# Patient Record
Sex: Female | Born: 1950 | Hispanic: No | Marital: Married | Smoking: Never smoker
Health system: Southern US, Community
[De-identification: ages and names within clinical notes are randomized; demographics above are authoritative.]

## PROBLEM LIST (undated history)

## (undated) DIAGNOSIS — E785 Hyperlipidemia, unspecified: Secondary | ICD-10-CM

## (undated) DIAGNOSIS — I10 Essential (primary) hypertension: Secondary | ICD-10-CM

---

## 2015-11-12 DIAGNOSIS — R739 Hyperglycemia, unspecified: Secondary | ICD-10-CM | POA: Diagnosis not present

## 2015-11-12 DIAGNOSIS — E7800 Pure hypercholesterolemia, unspecified: Secondary | ICD-10-CM | POA: Diagnosis not present

## 2015-11-12 DIAGNOSIS — M545 Low back pain: Secondary | ICD-10-CM | POA: Diagnosis not present

## 2015-11-12 DIAGNOSIS — E785 Hyperlipidemia, unspecified: Secondary | ICD-10-CM | POA: Diagnosis not present

## 2015-11-12 DIAGNOSIS — I10 Essential (primary) hypertension: Secondary | ICD-10-CM | POA: Diagnosis not present

## 2015-12-12 DIAGNOSIS — M9905 Segmental and somatic dysfunction of pelvic region: Secondary | ICD-10-CM | POA: Diagnosis not present

## 2015-12-12 DIAGNOSIS — M9903 Segmental and somatic dysfunction of lumbar region: Secondary | ICD-10-CM | POA: Diagnosis not present

## 2015-12-12 DIAGNOSIS — M5417 Radiculopathy, lumbosacral region: Secondary | ICD-10-CM | POA: Diagnosis not present

## 2015-12-12 DIAGNOSIS — M9901 Segmental and somatic dysfunction of cervical region: Secondary | ICD-10-CM | POA: Diagnosis not present

## 2015-12-18 DIAGNOSIS — M9901 Segmental and somatic dysfunction of cervical region: Secondary | ICD-10-CM | POA: Diagnosis not present

## 2015-12-18 DIAGNOSIS — M5417 Radiculopathy, lumbosacral region: Secondary | ICD-10-CM | POA: Diagnosis not present

## 2015-12-18 DIAGNOSIS — M9905 Segmental and somatic dysfunction of pelvic region: Secondary | ICD-10-CM | POA: Diagnosis not present

## 2015-12-18 DIAGNOSIS — M9903 Segmental and somatic dysfunction of lumbar region: Secondary | ICD-10-CM | POA: Diagnosis not present

## 2015-12-20 DIAGNOSIS — M9903 Segmental and somatic dysfunction of lumbar region: Secondary | ICD-10-CM | POA: Diagnosis not present

## 2015-12-20 DIAGNOSIS — M9905 Segmental and somatic dysfunction of pelvic region: Secondary | ICD-10-CM | POA: Diagnosis not present

## 2015-12-20 DIAGNOSIS — M5417 Radiculopathy, lumbosacral region: Secondary | ICD-10-CM | POA: Diagnosis not present

## 2015-12-20 DIAGNOSIS — M9901 Segmental and somatic dysfunction of cervical region: Secondary | ICD-10-CM | POA: Diagnosis not present

## 2015-12-23 DIAGNOSIS — M5417 Radiculopathy, lumbosacral region: Secondary | ICD-10-CM | POA: Diagnosis not present

## 2015-12-23 DIAGNOSIS — M9901 Segmental and somatic dysfunction of cervical region: Secondary | ICD-10-CM | POA: Diagnosis not present

## 2015-12-23 DIAGNOSIS — M9905 Segmental and somatic dysfunction of pelvic region: Secondary | ICD-10-CM | POA: Diagnosis not present

## 2015-12-23 DIAGNOSIS — M9903 Segmental and somatic dysfunction of lumbar region: Secondary | ICD-10-CM | POA: Diagnosis not present

## 2016-03-09 DIAGNOSIS — E663 Overweight: Secondary | ICD-10-CM | POA: Diagnosis not present

## 2016-03-09 DIAGNOSIS — E7800 Pure hypercholesterolemia, unspecified: Secondary | ICD-10-CM | POA: Diagnosis not present

## 2016-03-09 DIAGNOSIS — R739 Hyperglycemia, unspecified: Secondary | ICD-10-CM | POA: Diagnosis not present

## 2016-03-09 DIAGNOSIS — I10 Essential (primary) hypertension: Secondary | ICD-10-CM | POA: Diagnosis not present

## 2016-03-20 DIAGNOSIS — M79604 Pain in right leg: Secondary | ICD-10-CM | POA: Diagnosis not present

## 2016-03-20 DIAGNOSIS — M5441 Lumbago with sciatica, right side: Secondary | ICD-10-CM | POA: Diagnosis not present

## 2016-08-21 DIAGNOSIS — E2839 Other primary ovarian failure: Secondary | ICD-10-CM | POA: Diagnosis not present

## 2016-08-21 DIAGNOSIS — E7800 Pure hypercholesterolemia, unspecified: Secondary | ICD-10-CM | POA: Diagnosis not present

## 2016-08-21 DIAGNOSIS — Z0000 Encounter for general adult medical examination without abnormal findings: Secondary | ICD-10-CM | POA: Diagnosis not present

## 2016-08-21 DIAGNOSIS — Z23 Encounter for immunization: Secondary | ICD-10-CM | POA: Diagnosis not present

## 2016-10-05 DIAGNOSIS — E2839 Other primary ovarian failure: Secondary | ICD-10-CM | POA: Diagnosis not present

## 2017-02-18 DIAGNOSIS — E7800 Pure hypercholesterolemia, unspecified: Secondary | ICD-10-CM | POA: Diagnosis not present

## 2017-02-18 DIAGNOSIS — M791 Myalgia: Secondary | ICD-10-CM | POA: Diagnosis not present

## 2017-05-05 DIAGNOSIS — R1013 Epigastric pain: Secondary | ICD-10-CM | POA: Diagnosis not present

## 2017-05-07 ENCOUNTER — Other Ambulatory Visit: Payer: Self-pay | Admitting: Family Medicine

## 2017-05-07 DIAGNOSIS — R1013 Epigastric pain: Secondary | ICD-10-CM

## 2017-05-11 ENCOUNTER — Ambulatory Visit
Admission: RE | Admit: 2017-05-11 | Discharge: 2017-05-11 | Disposition: A | Discharge status: Home / Self Care | Payer: Medicare Other | Source: Ambulatory Visit | Attending: Family Medicine | Admitting: Family Medicine

## 2017-05-11 DIAGNOSIS — R1013 Epigastric pain: Secondary | ICD-10-CM | POA: Diagnosis not present

## 2017-08-27 DIAGNOSIS — E7800 Pure hypercholesterolemia, unspecified: Secondary | ICD-10-CM | POA: Diagnosis not present

## 2018-02-18 DIAGNOSIS — H9202 Otalgia, left ear: Secondary | ICD-10-CM | POA: Diagnosis not present

## 2018-02-24 DIAGNOSIS — E7800 Pure hypercholesterolemia, unspecified: Secondary | ICD-10-CM | POA: Diagnosis not present

## 2018-04-05 DIAGNOSIS — M5032 Other cervical disc degeneration, mid-cervical region, unspecified level: Secondary | ICD-10-CM | POA: Diagnosis not present

## 2018-04-05 DIAGNOSIS — M9901 Segmental and somatic dysfunction of cervical region: Secondary | ICD-10-CM | POA: Diagnosis not present

## 2018-04-05 DIAGNOSIS — M5136 Other intervertebral disc degeneration, lumbar region: Secondary | ICD-10-CM | POA: Diagnosis not present

## 2018-04-05 DIAGNOSIS — M9902 Segmental and somatic dysfunction of thoracic region: Secondary | ICD-10-CM | POA: Diagnosis not present

## 2018-04-05 DIAGNOSIS — M9903 Segmental and somatic dysfunction of lumbar region: Secondary | ICD-10-CM | POA: Diagnosis not present

## 2018-04-05 DIAGNOSIS — M5134 Other intervertebral disc degeneration, thoracic region: Secondary | ICD-10-CM | POA: Diagnosis not present

## 2018-04-07 DIAGNOSIS — M9903 Segmental and somatic dysfunction of lumbar region: Secondary | ICD-10-CM | POA: Diagnosis not present

## 2018-04-07 DIAGNOSIS — M5032 Other cervical disc degeneration, mid-cervical region, unspecified level: Secondary | ICD-10-CM | POA: Diagnosis not present

## 2018-04-07 DIAGNOSIS — M5134 Other intervertebral disc degeneration, thoracic region: Secondary | ICD-10-CM | POA: Diagnosis not present

## 2018-04-07 DIAGNOSIS — M9902 Segmental and somatic dysfunction of thoracic region: Secondary | ICD-10-CM | POA: Diagnosis not present

## 2018-04-07 DIAGNOSIS — M9901 Segmental and somatic dysfunction of cervical region: Secondary | ICD-10-CM | POA: Diagnosis not present

## 2018-04-07 DIAGNOSIS — M5136 Other intervertebral disc degeneration, lumbar region: Secondary | ICD-10-CM | POA: Diagnosis not present

## 2018-05-19 DIAGNOSIS — E785 Hyperlipidemia, unspecified: Secondary | ICD-10-CM | POA: Diagnosis not present

## 2018-09-06 DIAGNOSIS — E7800 Pure hypercholesterolemia, unspecified: Secondary | ICD-10-CM | POA: Diagnosis not present

## 2018-09-06 DIAGNOSIS — J301 Allergic rhinitis due to pollen: Secondary | ICD-10-CM | POA: Diagnosis not present

## 2018-09-06 DIAGNOSIS — K219 Gastro-esophageal reflux disease without esophagitis: Secondary | ICD-10-CM | POA: Diagnosis not present

## 2018-10-06 ENCOUNTER — Emergency Department (HOSPITAL_BASED_OUTPATIENT_CLINIC_OR_DEPARTMENT_OTHER): Payer: Medicare Other

## 2018-10-06 ENCOUNTER — Emergency Department (HOSPITAL_BASED_OUTPATIENT_CLINIC_OR_DEPARTMENT_OTHER)
Admission: EM | Admit: 2018-10-06 | Discharge: 2018-10-06 | Disposition: A | Discharge status: Home / Self Care | Payer: Medicare Other | Attending: Emergency Medicine | Admitting: Emergency Medicine

## 2018-10-06 ENCOUNTER — Encounter (HOSPITAL_BASED_OUTPATIENT_CLINIC_OR_DEPARTMENT_OTHER): Payer: Self-pay | Admitting: Emergency Medicine

## 2018-10-06 ENCOUNTER — Other Ambulatory Visit: Payer: Self-pay

## 2018-10-06 DIAGNOSIS — R000 Tachycardia, unspecified: Secondary | ICD-10-CM | POA: Diagnosis not present

## 2018-10-06 DIAGNOSIS — Z79899 Other long term (current) drug therapy: Secondary | ICD-10-CM | POA: Diagnosis not present

## 2018-10-06 DIAGNOSIS — K219 Gastro-esophageal reflux disease without esophagitis: Secondary | ICD-10-CM | POA: Diagnosis not present

## 2018-10-06 DIAGNOSIS — R079 Chest pain, unspecified: Secondary | ICD-10-CM | POA: Diagnosis not present

## 2018-10-06 DIAGNOSIS — R1011 Right upper quadrant pain: Secondary | ICD-10-CM | POA: Diagnosis not present

## 2018-10-06 DIAGNOSIS — R072 Precordial pain: Secondary | ICD-10-CM | POA: Diagnosis present

## 2018-10-06 HISTORY — DX: Hyperlipidemia, unspecified: E78.5

## 2018-10-06 LAB — URINALYSIS, ROUTINE W REFLEX MICROSCOPIC
Bilirubin Urine: NEGATIVE
Glucose, UA: NEGATIVE mg/dL
Hgb urine dipstick: NEGATIVE
KETONES UR: 15 mg/dL — AB
Leukocytes,Ua: NEGATIVE
NITRITE: NEGATIVE
Protein, ur: NEGATIVE mg/dL
Specific Gravity, Urine: >1.03 — ABNORMAL HIGH (ref 1.005–1.030)
pH: 5 (ref 5.0–8.0)

## 2018-10-06 LAB — COMPREHENSIVE METABOLIC PANEL
ALT: 28 U/L (ref 0–44)
AST: 33 U/L (ref 15–41)
Albumin: 4.3 g/dL (ref 3.5–5.0)
Alkaline Phosphatase: 58 U/L (ref 38–126)
Anion gap: 8 (ref 5–15)
BILIRUBIN TOTAL: 0.3 mg/dL (ref 0.3–1.2)
BUN: 14 mg/dL (ref 8–23)
CO2: 23 mmol/L (ref 22–32)
Calcium: 8.7 mg/dL — ABNORMAL LOW (ref 8.9–10.3)
Chloride: 106 mmol/L (ref 98–111)
Creatinine, Ser: 0.86 mg/dL (ref 0.44–1.00)
Glucose, Bld: 111 mg/dL — ABNORMAL HIGH (ref 70–99)
POTASSIUM: 3.9 mmol/L (ref 3.5–5.1)
Sodium: 137 mmol/L (ref 135–145)
TOTAL PROTEIN: 7.6 g/dL (ref 6.5–8.1)

## 2018-10-06 LAB — CBC
HCT: 43.6 % (ref 36.0–46.0)
Hemoglobin: 14 g/dL (ref 12.0–15.0)
MCH: 31.1 pg (ref 26.0–34.0)
MCHC: 32.1 g/dL (ref 30.0–36.0)
MCV: 96.9 fL (ref 80.0–100.0)
NRBC: 0 % (ref 0.0–0.2)
Platelets: 230 10*3/uL (ref 150–400)
RBC: 4.5 MIL/uL (ref 3.87–5.11)
RDW: 13 % (ref 11.5–15.5)
WBC: 5.7 10*3/uL (ref 4.0–10.5)

## 2018-10-06 LAB — LIPASE, BLOOD: Lipase: 41 U/L (ref 11–51)

## 2018-10-06 LAB — TROPONIN I
Troponin I: <0.03 ng/mL (ref <0.03)
Troponin I: <0.03 ng/mL (ref <0.03)

## 2018-10-06 MED ORDER — PANTOPRAZOLE SODIUM 40 MG PO TBEC: 40.0000 mg | DELAYED_RELEASE_TABLET | Freq: Every day | ORAL | 0 refills | Status: AC

## 2018-10-06 NOTE — Discharge Instructions (Signed)
You were seen and evaluated in the emergency department for chest pain. Your blood work was normal. Your ultrasound did not indicate that your gall bladder is causing your discomfort.  Please take the medication that was ordered for reflux. Try to cut down on dietary triggers for reflux.

## 2018-10-06 NOTE — ED Provider Notes (Signed)
[facility] EMERGENCY DEPARTMENT Provider Note   CSN: [id number] Arrival date & time: 10/06/18  5027    History   Chief Complaint Chief Complaint  Patient presents with  . Abdominal Pain    HPI Erica Keith is a 68 y.o. female with PMH HLD and GERD presenting with 8/10 non-radiating substernal chest pain of several hours duration. Patient reports that she woke at 6AM today with pain and was unable to go back to sleep. The pain sometimes radiates to her right flank. She did not have associated diaphoresis, palpitations, dyspnea, or nausea. No emesis. She does not use tobacco. She has pertinent family hx with father who died of massive MI in his 86's.   The patient reports that she has had similar symptoms over the last 2 days.  They primarily wake her from sleeping.  She reports that the symptoms do not seem to be related to ingestion of food.  However, her husband at bedside indicates that sometimes there is a relationship between the eating and the development of her chest discomfort.  Patient reports one prior episode of similar pain in which she was ruled out for ACS two years ago. She has not had any muscle aches or fever. She has chronic cough and scratchy which has not changed. She has seasonal allergies for which she takes claritin.      HPI  Past Medical History:  Diagnosis Date  . Hyperlipidemia     There are no active problems to display for this patient.   History reviewed. No pertinent surgical history.   OB History   No obstetric history on file.      Home Medications    Prior to Admission medications   Medication Sig Start Date End Date Taking? Authorizing Provider  rosuvastatin (CRESTOR) 10 MG tablet Take 10 mg by mouth daily.   Yes [provider]  pantoprazole (PROTONIX) 40 MG tablet Take 1 tablet (40 mg total) by mouth daily. 10/06/18 10/06/19  Howard Pouch, MD    Family History History reviewed. No pertinent family history.  Social  History Social History   Tobacco Use  . Smoking status: Never Smoker  . Smokeless tobacco: Never Used  Substance Use Topics  . Alcohol use: Never    Frequency: Never  . Drug use: Never     Allergies   Patient has no known allergies.   Review of Systems Review of Systems  Constitutional: Negative for chills, diaphoresis and fever.  HENT: Negative for congestion, rhinorrhea, sinus pressure, sneezing and sore throat.   Respiratory: Positive for cough. Negative for chest tightness, shortness of breath and wheezing.   Cardiovascular: Negative for leg swelling.  Gastrointestinal: Positive for constipation. Negative for abdominal distention, abdominal pain, blood in stool, diarrhea, nausea and vomiting.  Genitourinary: Negative for hematuria and urgency.  Musculoskeletal: Negative for back pain and myalgias.  Skin: Negative for rash.  Neurological: Positive for headaches.   Physical Exam Updated Vital Signs BP (!) 116/59 (BP Location: Left Arm)   Pulse 96   Temp 99.3 F (37.4 C) (Oral)   Resp 16   Ht 5\' 4"  (1.626 m)   Wt 78 kg   SpO2 96%   BMI 29.52 kg/m   Physical Exam Constitutional:      Appearance: She is well-developed. She is not toxic-appearing or diaphoretic.  HENT:     Head: Normocephalic and atraumatic.     Mouth/Throat:     Mouth: Mucous membranes are moist.  Cardiovascular:  Rate and Rhythm: Normal rate and regular rhythm.     Heart sounds: Normal heart sounds. No murmur. No friction rub. No gallop.   Pulmonary:     Effort: Pulmonary effort is normal. No respiratory distress.     Breath sounds: Normal breath sounds. No wheezing.  Chest:     Chest wall: Tenderness present.  Abdominal:     General: Bowel sounds are normal. There is no distension.     Palpations: Abdomen is soft. There is no hepatomegaly or splenomegaly.     Tenderness: There is no abdominal tenderness. There is no right CVA tenderness or left CVA tenderness. Negative signs include  Murphy's sign and Rovsing's sign.  Neurological:     Mental Status: She is alert.   = ED Treatments / Results  Labs (all labs ordered are listed, but only abnormal results are displayed) Labs Reviewed  COMPREHENSIVE METABOLIC PANEL - Abnormal; Notable for the following components:      Result Value   Glucose, Bld 111 (*)    Calcium 8.7 (*)    All other components within normal limits  URINALYSIS, ROUTINE W REFLEX MICROSCOPIC - Abnormal; Notable for the following components:   Specific Gravity, Urine >1.030 (*)    Ketones, ur 15 (*)    All other components within normal limits  CBC  LIPASE, BLOOD  TROPONIN I  TROPONIN I    EKG EKG Interpretation  Date/Time:  Thursday October 06 2018 10:14:15 EST Ventricular Rate:  104 PR Interval:    QRS Duration: 81 QT Interval:  401 QTC Calculation: 528 R Axis:   -35 Text Interpretation:  Sinus tachycardia Left axis deviation Abnormal R-wave progression, late transition Borderline T abnormalities, anterior leads Prolonged QT interval No previous ECGs available Confirmed by Frederick Peers [phone]) on 10/06/2018 11:20:37 AM   Radiology Dg Chest 2 View  Result Date: 10/06/2018 CLINICAL DATA:  Chest pain EXAM: CHEST - 2 VIEW COMPARISON:  None. FINDINGS: The heart size and mediastinal contours are within normal limits. Both lungs are clear. The visualized skeletal structures are unremarkable. IMPRESSION: No active cardiopulmonary disease. Electronically Signed   By: Alcide Clever M.D.   On: 10/06/2018 11:09   US Abdomen Limited Ruq  Result Date: 10/06/2018 CLINICAL DATA:  Postprandial right upper quadrant pain. EXAM: ULTRASOUND ABDOMEN LIMITED RIGHT UPPER QUADRANT COMPARISON:  05/11/2017 FINDINGS: Gallbladder: No gallstones or wall thickening visualized. 3 mm gallbladder polyp. No sonographic Murphy sign noted by sonographer. Common bile duct: Diameter: 3.3 mm. Liver: Increased parenchymal echogenicity of the liver. Geographic area of hypoattenuation  adjacent to the gallbladder fossa. Portal vein is patent on color Doppler imaging with normal direction of blood flow towards the liver. IMPRESSION: Increased parenchymal echogenicity of the liver, consistent with hepatic steatosis or fibrosis. Geographic area of hypoattenuation adjacent to the gallbladder fossa, usually associated with focal fatty sparing. If patient has a known risk factors for hepatocellular carcinoma, then MRI of the abdomen may be obtained for further evaluation. Electronically Signed   By: Ted Mcalpine M.D.   On: 10/06/2018 13:09     Procedures Procedures (including critical care time)  Medications Ordered in ED Medications - No data to display   Initial Impression / Assessment and Plan / ED Course  I have reviewed the triage vital signs and the nursing notes.  Pertinent labs & imaging results that were available during my care of the patient were reviewed by me and considered in my medical decision making (see chart for details).  Atypical chest pain EKG without ischemia CXR WNL Lab work shows WBC 5.7, Hgb 14.0, lipase 41, LFT's WNL Troponin negative RUQ U/S demonstrated normal gallbladder with no gallstones or wall thickening visualized.  There was a 3 mm gallbladder polyp.  No sonographic Murphy sign.  Additionally there was noted increased parenchymal echogenicity of the liver, consistent with hepatic steatosis or fibrosis.  It was recommended that if the patient has no known risk factors for hepatocellular carcinoma, then MRI of the abdomen may be obtained for further evaluation.  Signs and symptoms are most consistent with acid reflux.  ACS work-up was negative.  Patient was concerned about potential for gallbladder pathology, however ultrasound was not consistent with this.  Patient was discharged with recommendations to decrease dietary triggers for reflux.  She was given a 14-day course of pantoprazole.  She was recommended to follow-up with her  PCP for further management.  Return precautions were reviewed.  Final Clinical Impressions(s) / ED Diagnoses   Final diagnoses:  Postprandial RUQ pain  Gastroesophageal reflux disease, esophagitis presence not specified    ED Discharge Orders         Ordered    pantoprazole (PROTONIX) 40 MG tablet  Daily     10/06/18 1330           Howard Pouch, MD 10/06/18 1343    Little, Ambrose Finland, MD 10/12/18 [phone]

## 2018-10-06 NOTE — ED Triage Notes (Signed)
Reports mid upper abdominal pain x 2 days.  Denies N/V/D.  States the pain radiates into her right flank.

## 2018-10-06 NOTE — ED Notes (Signed)
Pt given water per PA Lauren

## 2019-03-22 DIAGNOSIS — J301 Allergic rhinitis due to pollen: Secondary | ICD-10-CM | POA: Diagnosis not present

## 2019-03-22 DIAGNOSIS — E7800 Pure hypercholesterolemia, unspecified: Secondary | ICD-10-CM | POA: Diagnosis not present

## 2019-03-22 DIAGNOSIS — K219 Gastro-esophageal reflux disease without esophagitis: Secondary | ICD-10-CM | POA: Diagnosis not present

## 2019-07-10 DIAGNOSIS — K219 Gastro-esophageal reflux disease without esophagitis: Secondary | ICD-10-CM | POA: Diagnosis not present

## 2019-07-10 DIAGNOSIS — E7800 Pure hypercholesterolemia, unspecified: Secondary | ICD-10-CM | POA: Diagnosis not present

## 2019-07-10 DIAGNOSIS — J301 Allergic rhinitis due to pollen: Secondary | ICD-10-CM | POA: Diagnosis not present

## 2019-07-10 DIAGNOSIS — N3000 Acute cystitis without hematuria: Secondary | ICD-10-CM | POA: Diagnosis not present

## 2019-11-15 IMAGING — US US ABDOMEN LIMITED
1 series · 14 of 25 positions shown · non-contrast
Comparison: 05/11/2017

CLINICAL DATA: Postprandial right upper quadrant pain.

EXAM:
ULTRASOUND ABDOMEN LIMITED RIGHT UPPER QUADRANT

[Series 1: us abdomen limited · 0.09mm/px · 14 of 60 slices shown]
[im 1/60]
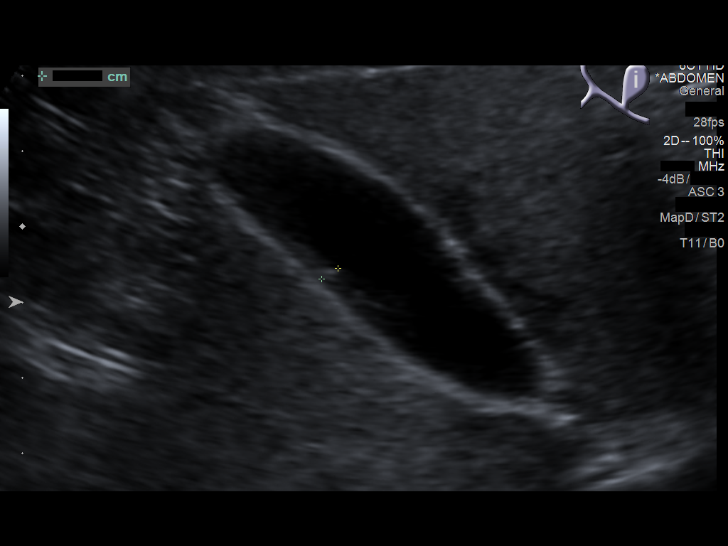
[im 5/60]
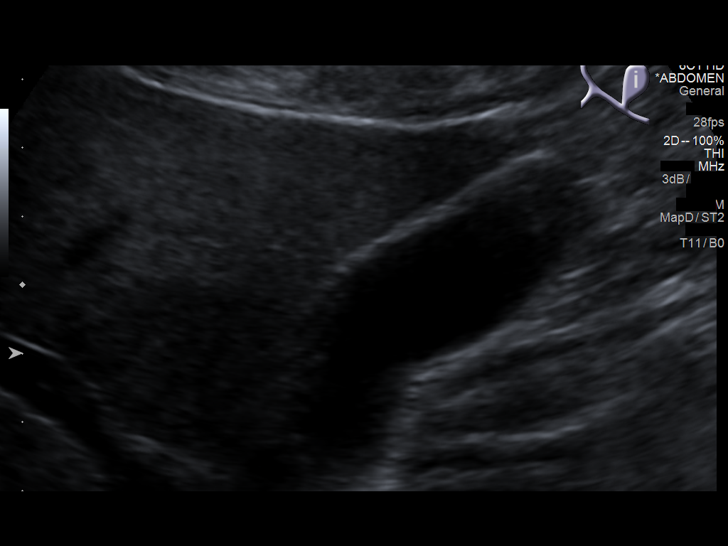
[im 10/60]
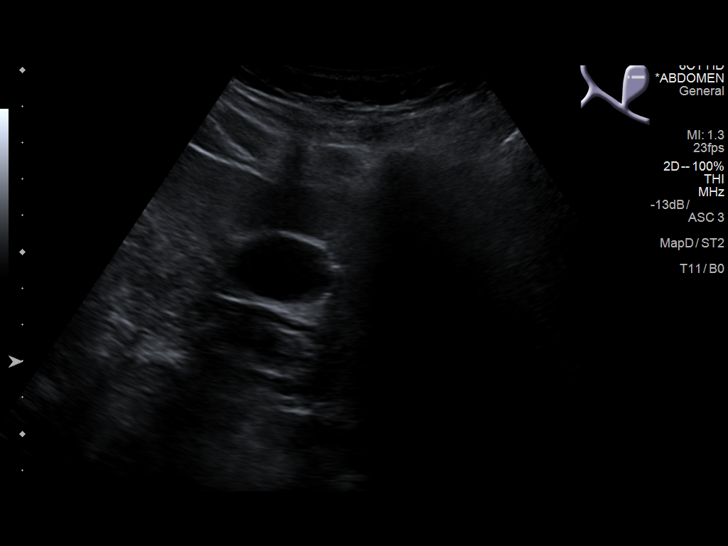
[im 15/60]
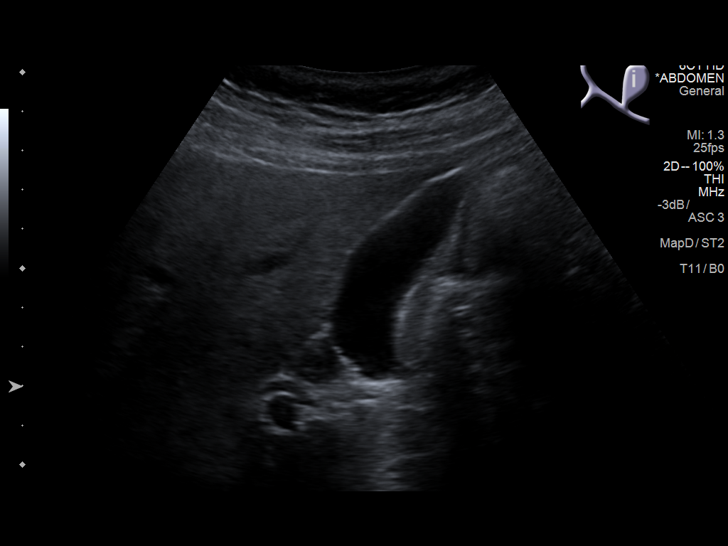
[im 20/60]
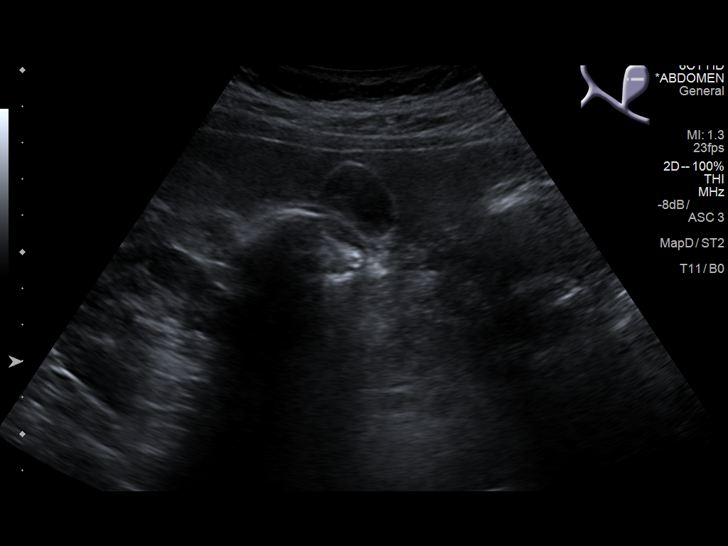
[im 23/60]
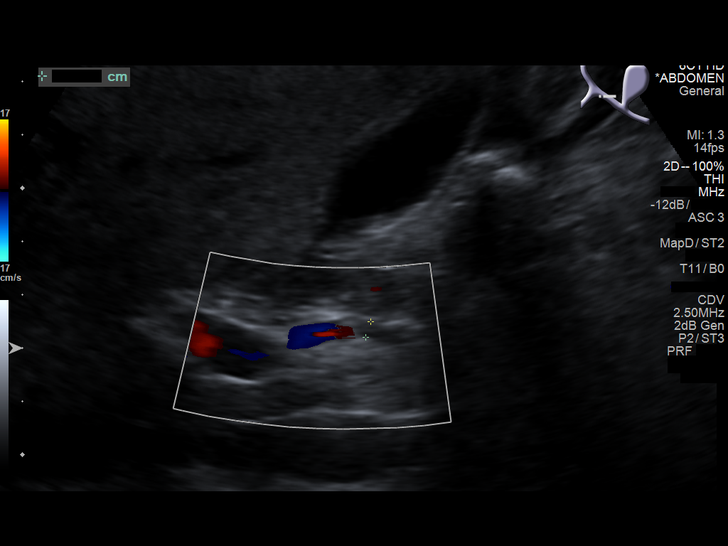
[im 28/60]
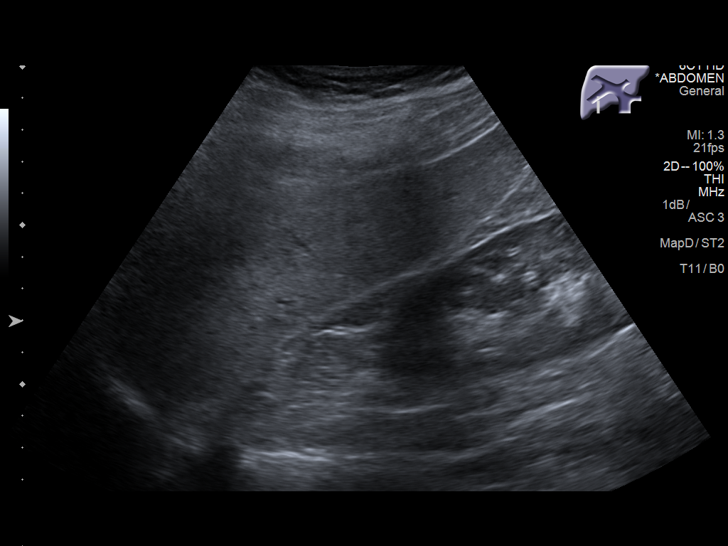
[im 32/60]
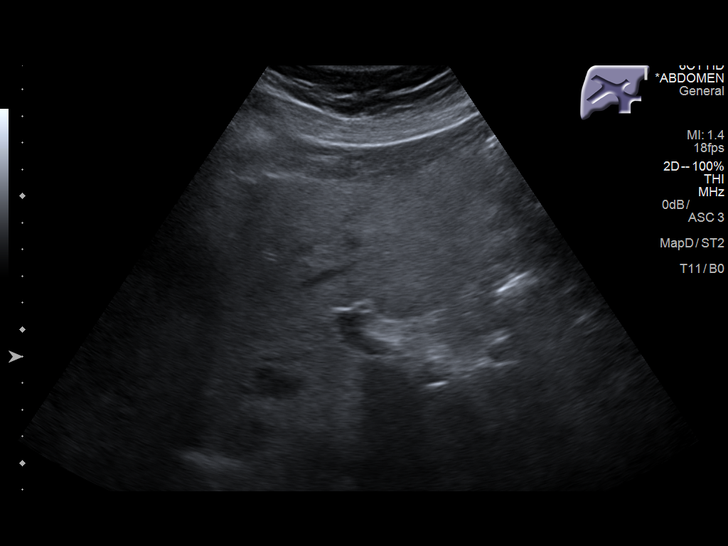
[im 37/60]
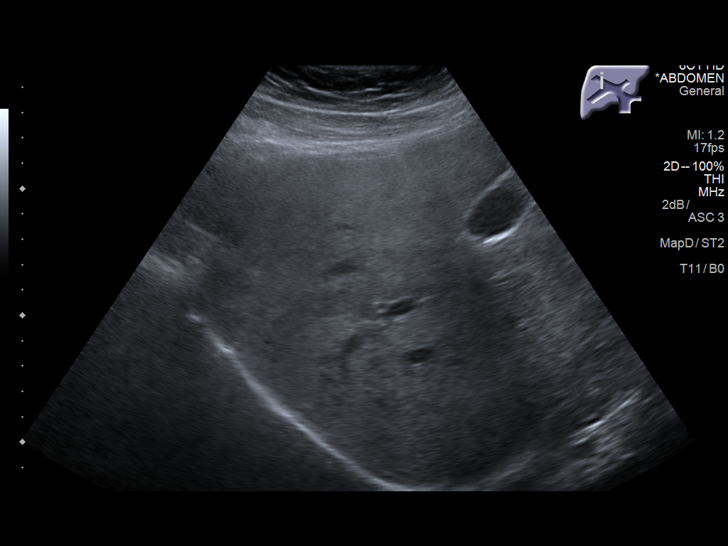
[im 40/60]
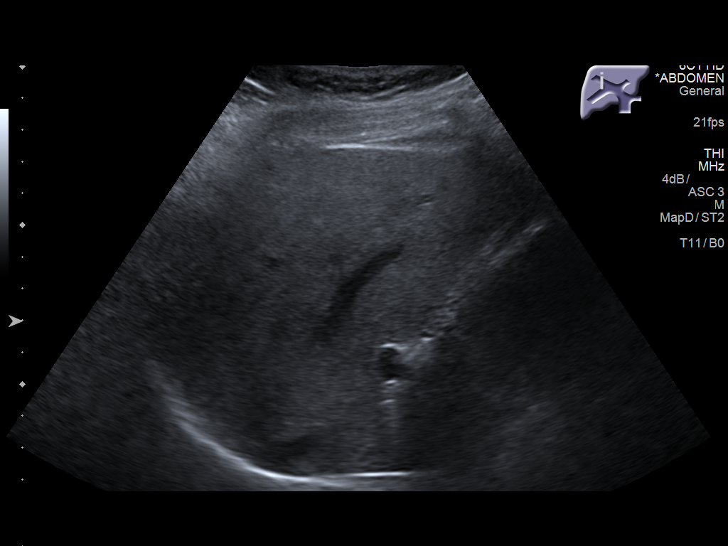
[im 45/60]
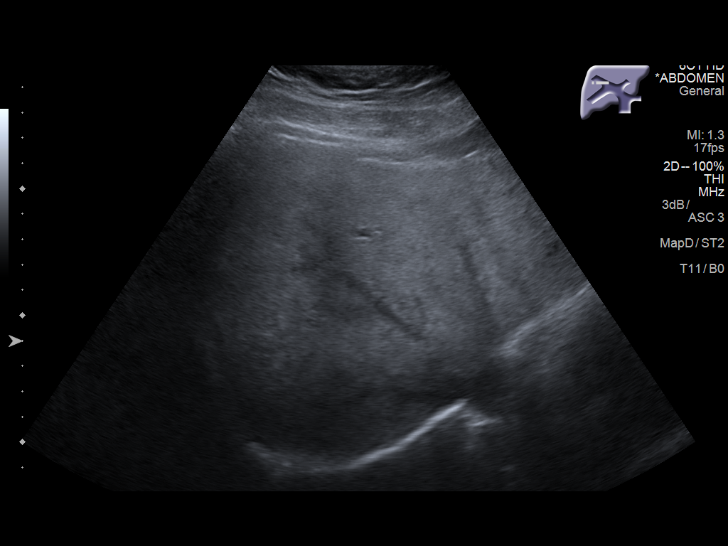
[im 50/60]
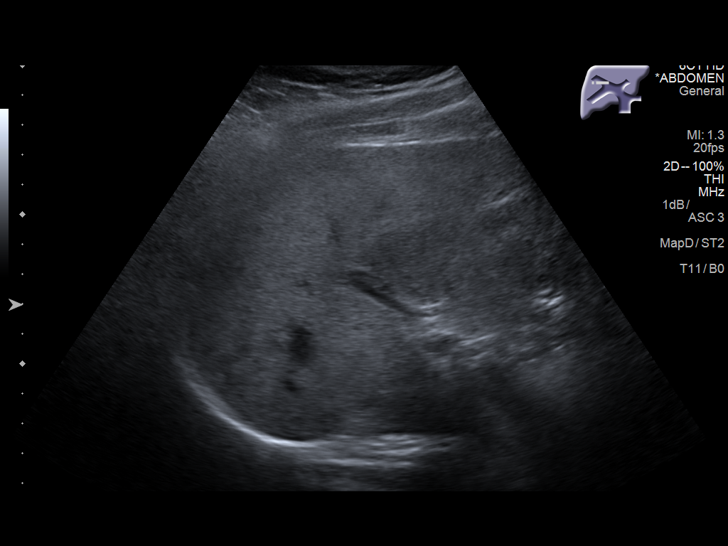
[im 55/60]
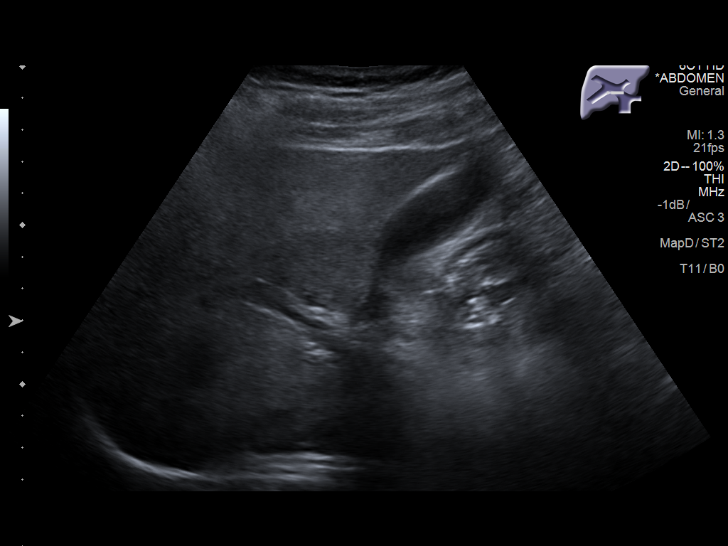
[im 60/60]
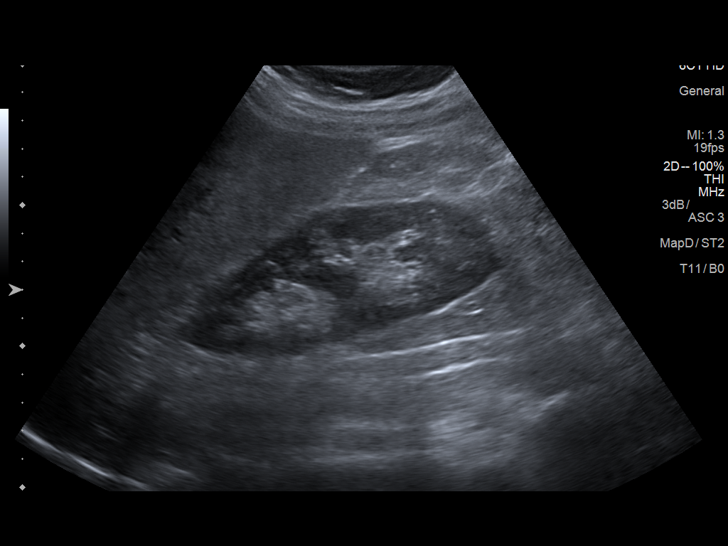

[14 of 25 positions shown; findings below may reference images not displayed]

FINDINGS: Gallbladder:

No gallstones or wall thickening visualized. 3 mm gallbladder polyp.
No sonographic Murphy sign noted by sonographer.

Common bile duct:

Diameter: 3.3 mm.

Liver:

Increased parenchymal echogenicity of the liver. Geographic area of
hypoattenuation adjacent to the gallbladder fossa. Portal vein is
patent on color Doppler imaging with normal direction of blood flow
towards the liver.
IMPRESSION: Increased parenchymal echogenicity of the liver, consistent with
hepatic steatosis or fibrosis.

Geographic area of hypoattenuation adjacent to the gallbladder
fossa, usually associated with focal fatty sparing. If patient has a
known risk factors for hepatocellular carcinoma, then MRI of the
abdomen may be obtained for further evaluation.

## 2020-01-24 DIAGNOSIS — R42 Dizziness and giddiness: Secondary | ICD-10-CM | POA: Diagnosis not present

## 2020-01-24 DIAGNOSIS — E7800 Pure hypercholesterolemia, unspecified: Secondary | ICD-10-CM | POA: Diagnosis not present

## 2020-01-24 DIAGNOSIS — J301 Allergic rhinitis due to pollen: Secondary | ICD-10-CM | POA: Diagnosis not present

## 2020-04-26 IMAGING — CR DG CHEST 2V
2 series · 2 of 2 positions shown · non-contrast
Comparison: None.

CLINICAL DATA: Chest pain

EXAM:
CHEST - 2 VIEW

[w chest pa]
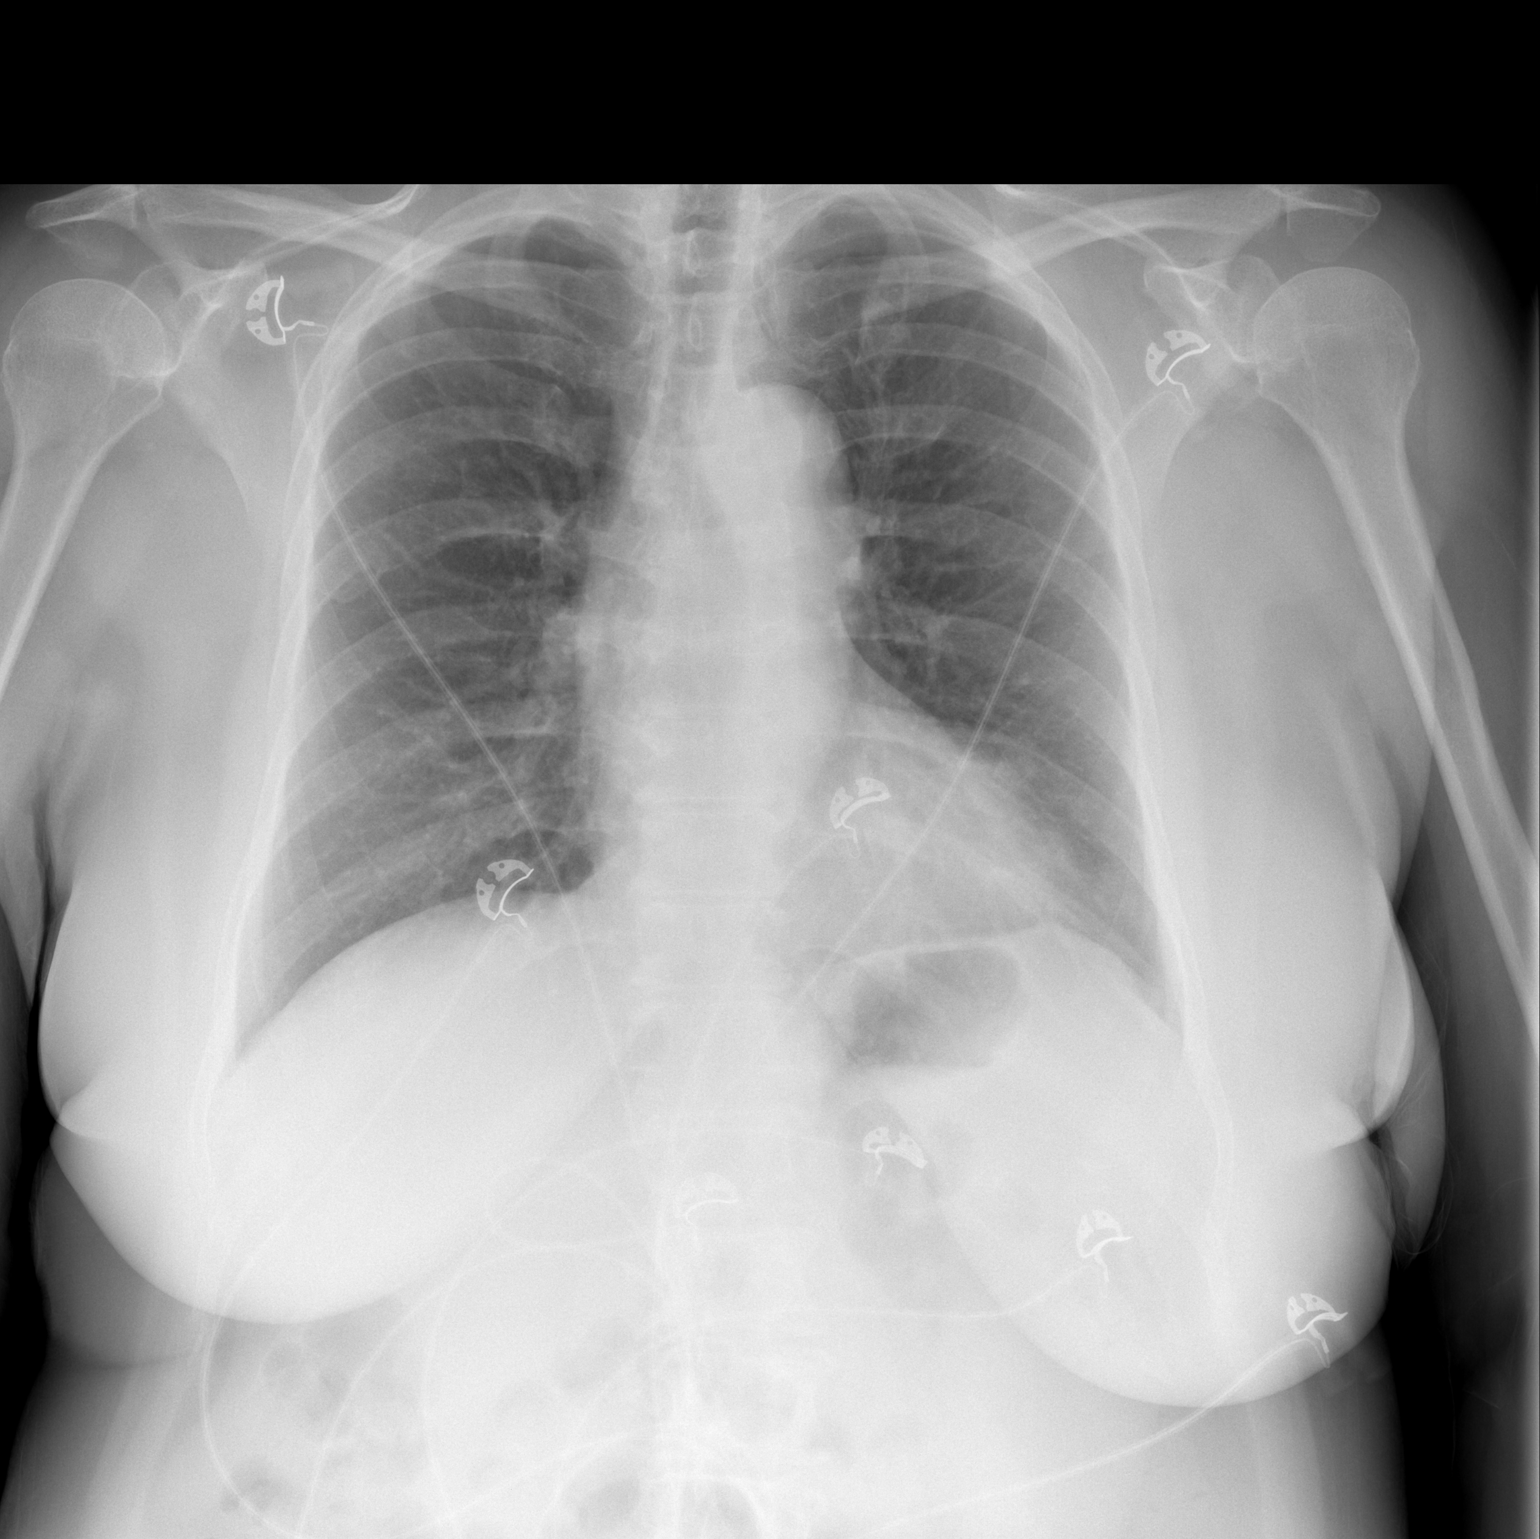

[w chest lat]
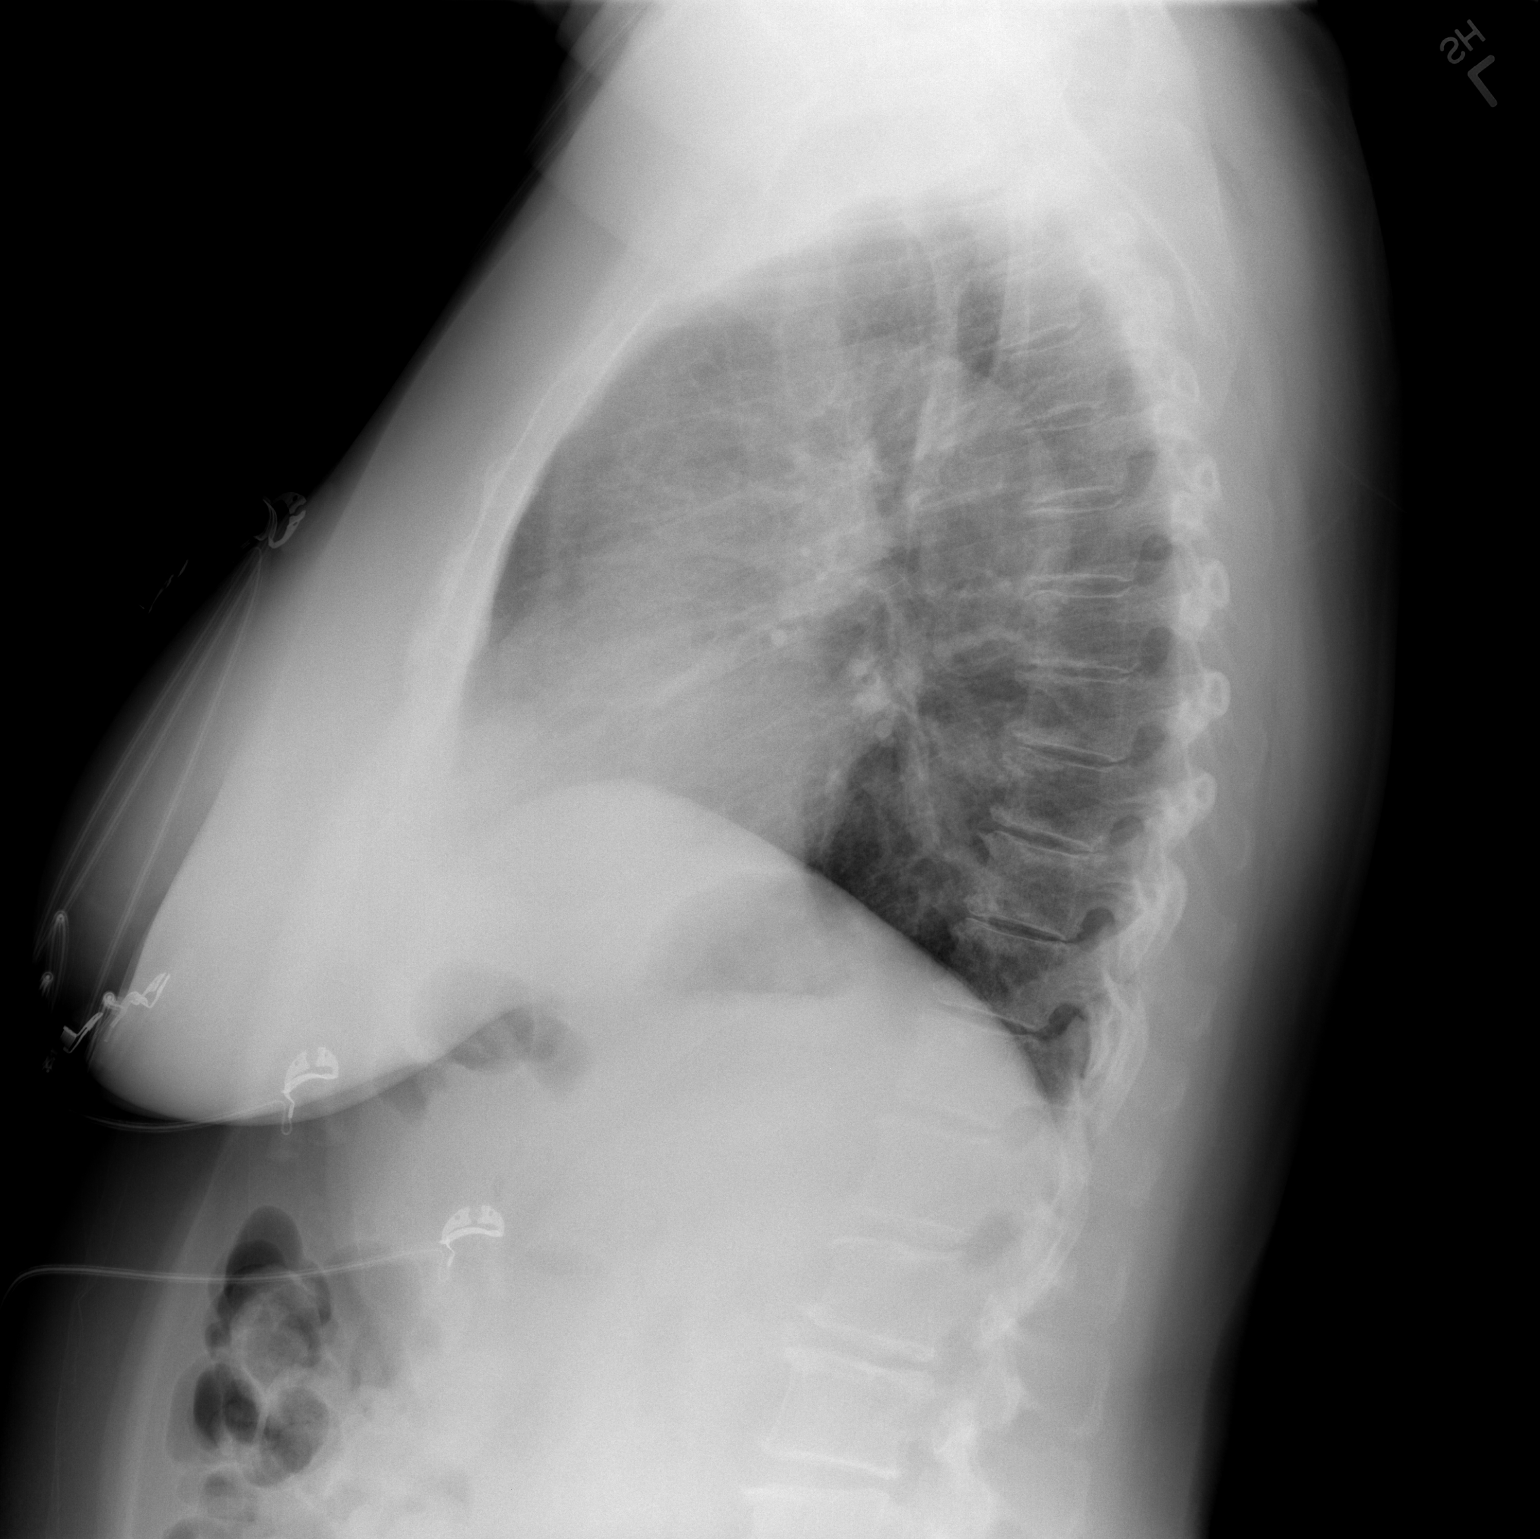

[2 of 2 positions shown; findings below may reference images not displayed]

FINDINGS: The heart size and mediastinal contours are within normal limits.
Both lungs are clear. The visualized skeletal structures are
unremarkable.
IMPRESSION: No active cardiopulmonary disease.

## 2020-08-01 DIAGNOSIS — S0502XA Injury of conjunctiva and corneal abrasion without foreign body, left eye, initial encounter: Secondary | ICD-10-CM | POA: Diagnosis not present

## 2020-08-01 DIAGNOSIS — T1512XA Foreign body in conjunctival sac, left eye, initial encounter: Secondary | ICD-10-CM | POA: Diagnosis not present

## 2020-08-06 DIAGNOSIS — S0502XD Injury of conjunctiva and corneal abrasion without foreign body, left eye, subsequent encounter: Secondary | ICD-10-CM | POA: Diagnosis not present

## 2020-08-09 DIAGNOSIS — I10 Essential (primary) hypertension: Secondary | ICD-10-CM | POA: Diagnosis not present

## 2020-08-09 DIAGNOSIS — G44219 Episodic tension-type headache, not intractable: Secondary | ICD-10-CM | POA: Diagnosis not present

## 2020-10-03 DIAGNOSIS — R059 Cough, unspecified: Secondary | ICD-10-CM | POA: Diagnosis not present

## 2020-10-03 DIAGNOSIS — U071 COVID-19: Secondary | ICD-10-CM | POA: Diagnosis not present

## 2021-01-28 DIAGNOSIS — I10 Essential (primary) hypertension: Secondary | ICD-10-CM | POA: Diagnosis not present

## 2021-01-28 DIAGNOSIS — E7800 Pure hypercholesterolemia, unspecified: Secondary | ICD-10-CM | POA: Diagnosis not present

## 2021-08-14 DIAGNOSIS — E7800 Pure hypercholesterolemia, unspecified: Secondary | ICD-10-CM | POA: Diagnosis not present

## 2021-08-14 DIAGNOSIS — I10 Essential (primary) hypertension: Secondary | ICD-10-CM | POA: Diagnosis not present

## 2021-09-10 DIAGNOSIS — M5032 Other cervical disc degeneration, mid-cervical region, unspecified level: Secondary | ICD-10-CM | POA: Diagnosis not present

## 2021-09-10 DIAGNOSIS — M5134 Other intervertebral disc degeneration, thoracic region: Secondary | ICD-10-CM | POA: Diagnosis not present

## 2021-09-10 DIAGNOSIS — M5136 Other intervertebral disc degeneration, lumbar region: Secondary | ICD-10-CM | POA: Diagnosis not present

## 2021-09-10 DIAGNOSIS — M9903 Segmental and somatic dysfunction of lumbar region: Secondary | ICD-10-CM | POA: Diagnosis not present

## 2021-09-10 DIAGNOSIS — M9902 Segmental and somatic dysfunction of thoracic region: Secondary | ICD-10-CM | POA: Diagnosis not present

## 2021-09-10 DIAGNOSIS — M9901 Segmental and somatic dysfunction of cervical region: Secondary | ICD-10-CM | POA: Diagnosis not present

## 2021-09-17 DIAGNOSIS — M9901 Segmental and somatic dysfunction of cervical region: Secondary | ICD-10-CM | POA: Diagnosis not present

## 2021-09-17 DIAGNOSIS — M9903 Segmental and somatic dysfunction of lumbar region: Secondary | ICD-10-CM | POA: Diagnosis not present

## 2021-09-17 DIAGNOSIS — M5134 Other intervertebral disc degeneration, thoracic region: Secondary | ICD-10-CM | POA: Diagnosis not present

## 2021-09-17 DIAGNOSIS — M9902 Segmental and somatic dysfunction of thoracic region: Secondary | ICD-10-CM | POA: Diagnosis not present

## 2021-09-17 DIAGNOSIS — M5136 Other intervertebral disc degeneration, lumbar region: Secondary | ICD-10-CM | POA: Diagnosis not present

## 2021-09-17 DIAGNOSIS — M5032 Other cervical disc degeneration, mid-cervical region, unspecified level: Secondary | ICD-10-CM | POA: Diagnosis not present

## 2021-10-01 DIAGNOSIS — M5136 Other intervertebral disc degeneration, lumbar region: Secondary | ICD-10-CM | POA: Diagnosis not present

## 2021-10-01 DIAGNOSIS — M5032 Other cervical disc degeneration, mid-cervical region, unspecified level: Secondary | ICD-10-CM | POA: Diagnosis not present

## 2021-10-01 DIAGNOSIS — M9901 Segmental and somatic dysfunction of cervical region: Secondary | ICD-10-CM | POA: Diagnosis not present

## 2021-10-01 DIAGNOSIS — M9903 Segmental and somatic dysfunction of lumbar region: Secondary | ICD-10-CM | POA: Diagnosis not present

## 2021-10-01 DIAGNOSIS — M5134 Other intervertebral disc degeneration, thoracic region: Secondary | ICD-10-CM | POA: Diagnosis not present

## 2021-10-01 DIAGNOSIS — M9902 Segmental and somatic dysfunction of thoracic region: Secondary | ICD-10-CM | POA: Diagnosis not present

## 2021-10-09 DIAGNOSIS — M4726 Other spondylosis with radiculopathy, lumbar region: Secondary | ICD-10-CM | POA: Diagnosis not present

## 2021-10-09 DIAGNOSIS — M4727 Other spondylosis with radiculopathy, lumbosacral region: Secondary | ICD-10-CM | POA: Diagnosis not present

## 2021-10-09 DIAGNOSIS — M5416 Radiculopathy, lumbar region: Secondary | ICD-10-CM | POA: Diagnosis not present

## 2021-10-22 DIAGNOSIS — G8929 Other chronic pain: Secondary | ICD-10-CM | POA: Diagnosis not present

## 2021-10-22 DIAGNOSIS — M5416 Radiculopathy, lumbar region: Secondary | ICD-10-CM | POA: Diagnosis not present

## 2021-10-22 DIAGNOSIS — Z789 Other specified health status: Secondary | ICD-10-CM | POA: Diagnosis not present

## 2021-10-22 DIAGNOSIS — Z7409 Other reduced mobility: Secondary | ICD-10-CM | POA: Diagnosis not present

## 2021-10-22 DIAGNOSIS — M25551 Pain in right hip: Secondary | ICD-10-CM | POA: Diagnosis not present

## 2021-10-22 DIAGNOSIS — M25651 Stiffness of right hip, not elsewhere classified: Secondary | ICD-10-CM | POA: Diagnosis not present

## 2021-10-29 DIAGNOSIS — M5416 Radiculopathy, lumbar region: Secondary | ICD-10-CM | POA: Diagnosis not present

## 2021-10-29 DIAGNOSIS — Z789 Other specified health status: Secondary | ICD-10-CM | POA: Diagnosis not present

## 2021-10-29 DIAGNOSIS — Z7409 Other reduced mobility: Secondary | ICD-10-CM | POA: Diagnosis not present

## 2021-10-29 DIAGNOSIS — M25551 Pain in right hip: Secondary | ICD-10-CM | POA: Diagnosis not present

## 2021-10-29 DIAGNOSIS — G8929 Other chronic pain: Secondary | ICD-10-CM | POA: Diagnosis not present

## 2021-10-29 DIAGNOSIS — M25651 Stiffness of right hip, not elsewhere classified: Secondary | ICD-10-CM | POA: Diagnosis not present

## 2021-11-03 DIAGNOSIS — M5416 Radiculopathy, lumbar region: Secondary | ICD-10-CM | POA: Diagnosis not present

## 2021-11-03 DIAGNOSIS — Z7409 Other reduced mobility: Secondary | ICD-10-CM | POA: Diagnosis not present

## 2021-11-03 DIAGNOSIS — M25551 Pain in right hip: Secondary | ICD-10-CM | POA: Diagnosis not present

## 2021-11-03 DIAGNOSIS — G8929 Other chronic pain: Secondary | ICD-10-CM | POA: Diagnosis not present

## 2021-11-03 DIAGNOSIS — Z789 Other specified health status: Secondary | ICD-10-CM | POA: Diagnosis not present

## 2021-11-03 DIAGNOSIS — M25651 Stiffness of right hip, not elsewhere classified: Secondary | ICD-10-CM | POA: Diagnosis not present

## 2021-11-10 DIAGNOSIS — M25551 Pain in right hip: Secondary | ICD-10-CM | POA: Diagnosis not present

## 2021-11-10 DIAGNOSIS — Z7409 Other reduced mobility: Secondary | ICD-10-CM | POA: Diagnosis not present

## 2021-11-10 DIAGNOSIS — M5416 Radiculopathy, lumbar region: Secondary | ICD-10-CM | POA: Diagnosis not present

## 2021-11-10 DIAGNOSIS — Z789 Other specified health status: Secondary | ICD-10-CM | POA: Diagnosis not present

## 2021-11-10 DIAGNOSIS — G8929 Other chronic pain: Secondary | ICD-10-CM | POA: Diagnosis not present

## 2021-11-10 DIAGNOSIS — M25651 Stiffness of right hip, not elsewhere classified: Secondary | ICD-10-CM | POA: Diagnosis not present

## 2022-02-13 DIAGNOSIS — E7800 Pure hypercholesterolemia, unspecified: Secondary | ICD-10-CM | POA: Diagnosis not present

## 2022-02-13 DIAGNOSIS — I10 Essential (primary) hypertension: Secondary | ICD-10-CM | POA: Diagnosis not present

## 2022-02-13 DIAGNOSIS — E2839 Other primary ovarian failure: Secondary | ICD-10-CM | POA: Diagnosis not present

## 2022-02-13 DIAGNOSIS — M5440 Lumbago with sciatica, unspecified side: Secondary | ICD-10-CM | POA: Diagnosis not present

## 2022-02-17 ENCOUNTER — Other Ambulatory Visit: Payer: Self-pay | Admitting: Family Medicine

## 2022-02-17 DIAGNOSIS — E2839 Other primary ovarian failure: Secondary | ICD-10-CM

## 2022-02-25 ENCOUNTER — Other Ambulatory Visit: Payer: Self-pay | Admitting: Family Medicine

## 2022-02-25 DIAGNOSIS — E2839 Other primary ovarian failure: Secondary | ICD-10-CM

## 2022-03-26 ENCOUNTER — Ambulatory Visit
Admission: RE | Admit: 2022-03-26 | Discharge: 2022-03-26 | Disposition: A | Discharge status: Home / Self Care | Payer: Medicare Other | Source: Ambulatory Visit | Attending: Family Medicine | Admitting: Family Medicine

## 2022-03-26 DIAGNOSIS — Z1231 Encounter for screening mammogram for malignant neoplasm of breast: Secondary | ICD-10-CM | POA: Diagnosis not present

## 2022-03-26 DIAGNOSIS — E2839 Other primary ovarian failure: Secondary | ICD-10-CM

## 2022-03-26 DIAGNOSIS — M85832 Other specified disorders of bone density and structure, left forearm: Secondary | ICD-10-CM | POA: Diagnosis not present

## 2022-03-26 DIAGNOSIS — Z780 Asymptomatic menopausal state: Secondary | ICD-10-CM | POA: Diagnosis not present

## 2022-03-31 ENCOUNTER — Other Ambulatory Visit: Payer: Self-pay | Admitting: Family Medicine

## 2022-03-31 DIAGNOSIS — R928 Other abnormal and inconclusive findings on diagnostic imaging of breast: Secondary | ICD-10-CM

## 2022-04-02 DIAGNOSIS — M25511 Pain in right shoulder: Secondary | ICD-10-CM | POA: Diagnosis not present

## 2022-04-02 DIAGNOSIS — M19011 Primary osteoarthritis, right shoulder: Secondary | ICD-10-CM | POA: Diagnosis not present

## 2022-04-02 DIAGNOSIS — M65331 Trigger finger, right middle finger: Secondary | ICD-10-CM | POA: Diagnosis not present

## 2022-04-08 DIAGNOSIS — M65331 Trigger finger, right middle finger: Secondary | ICD-10-CM | POA: Diagnosis not present

## 2022-04-08 DIAGNOSIS — M19011 Primary osteoarthritis, right shoulder: Secondary | ICD-10-CM | POA: Diagnosis not present

## 2022-04-15 ENCOUNTER — Ambulatory Visit
Admission: RE | Admit: 2022-04-15 | Discharge: 2022-04-15 | Disposition: A | Discharge status: Home / Self Care | Payer: Medicare Other | Source: Ambulatory Visit | Attending: Family Medicine | Admitting: Family Medicine

## 2022-04-15 ENCOUNTER — Ambulatory Visit: Payer: Medicare Other

## 2022-04-15 DIAGNOSIS — R928 Other abnormal and inconclusive findings on diagnostic imaging of breast: Secondary | ICD-10-CM | POA: Diagnosis not present

## 2022-04-22 DIAGNOSIS — M546 Pain in thoracic spine: Secondary | ICD-10-CM | POA: Diagnosis not present

## 2022-04-22 DIAGNOSIS — M5136 Other intervertebral disc degeneration, lumbar region: Secondary | ICD-10-CM | POA: Diagnosis not present

## 2022-04-22 DIAGNOSIS — M542 Cervicalgia: Secondary | ICD-10-CM | POA: Diagnosis not present

## 2022-04-22 DIAGNOSIS — M47896 Other spondylosis, lumbar region: Secondary | ICD-10-CM | POA: Diagnosis not present

## 2022-04-22 DIAGNOSIS — M47814 Spondylosis without myelopathy or radiculopathy, thoracic region: Secondary | ICD-10-CM | POA: Diagnosis not present

## 2022-05-28 DIAGNOSIS — M6530 Trigger finger, unspecified finger: Secondary | ICD-10-CM | POA: Diagnosis not present

## 2022-05-28 DIAGNOSIS — M5416 Radiculopathy, lumbar region: Secondary | ICD-10-CM | POA: Diagnosis not present

## 2022-10-02 DIAGNOSIS — E2839 Other primary ovarian failure: Secondary | ICD-10-CM | POA: Diagnosis not present

## 2022-10-02 DIAGNOSIS — M5440 Lumbago with sciatica, unspecified side: Secondary | ICD-10-CM | POA: Diagnosis not present

## 2022-10-02 DIAGNOSIS — Z0000 Encounter for general adult medical examination without abnormal findings: Secondary | ICD-10-CM | POA: Diagnosis not present

## 2022-10-02 DIAGNOSIS — I10 Essential (primary) hypertension: Secondary | ICD-10-CM | POA: Diagnosis not present

## 2022-10-02 DIAGNOSIS — E7800 Pure hypercholesterolemia, unspecified: Secondary | ICD-10-CM | POA: Diagnosis not present

## 2022-11-02 DIAGNOSIS — E7800 Pure hypercholesterolemia, unspecified: Secondary | ICD-10-CM | POA: Diagnosis not present

## 2022-11-02 DIAGNOSIS — I10 Essential (primary) hypertension: Secondary | ICD-10-CM | POA: Diagnosis not present

## 2022-11-18 DIAGNOSIS — H524 Presbyopia: Secondary | ICD-10-CM | POA: Diagnosis not present

## 2022-11-18 DIAGNOSIS — Z135 Encounter for screening for eye and ear disorders: Secondary | ICD-10-CM | POA: Diagnosis not present

## 2022-11-18 DIAGNOSIS — H2513 Age-related nuclear cataract, bilateral: Secondary | ICD-10-CM | POA: Diagnosis not present

## 2023-01-26 DIAGNOSIS — M5136 Other intervertebral disc degeneration, lumbar region: Secondary | ICD-10-CM | POA: Diagnosis not present

## 2023-01-26 DIAGNOSIS — M9901 Segmental and somatic dysfunction of cervical region: Secondary | ICD-10-CM | POA: Diagnosis not present

## 2023-01-26 DIAGNOSIS — M9903 Segmental and somatic dysfunction of lumbar region: Secondary | ICD-10-CM | POA: Diagnosis not present

## 2023-01-26 DIAGNOSIS — M9902 Segmental and somatic dysfunction of thoracic region: Secondary | ICD-10-CM | POA: Diagnosis not present

## 2023-01-26 DIAGNOSIS — M5134 Other intervertebral disc degeneration, thoracic region: Secondary | ICD-10-CM | POA: Diagnosis not present

## 2023-01-26 DIAGNOSIS — M5032 Other cervical disc degeneration, mid-cervical region, unspecified level: Secondary | ICD-10-CM | POA: Diagnosis not present

## 2023-02-02 DIAGNOSIS — M9901 Segmental and somatic dysfunction of cervical region: Secondary | ICD-10-CM | POA: Diagnosis not present

## 2023-02-02 DIAGNOSIS — M5032 Other cervical disc degeneration, mid-cervical region, unspecified level: Secondary | ICD-10-CM | POA: Diagnosis not present

## 2023-02-02 DIAGNOSIS — M5136 Other intervertebral disc degeneration, lumbar region: Secondary | ICD-10-CM | POA: Diagnosis not present

## 2023-02-02 DIAGNOSIS — M5134 Other intervertebral disc degeneration, thoracic region: Secondary | ICD-10-CM | POA: Diagnosis not present

## 2023-02-02 DIAGNOSIS — M9903 Segmental and somatic dysfunction of lumbar region: Secondary | ICD-10-CM | POA: Diagnosis not present

## 2023-02-02 DIAGNOSIS — M9902 Segmental and somatic dysfunction of thoracic region: Secondary | ICD-10-CM | POA: Diagnosis not present

## 2023-04-19 DIAGNOSIS — I10 Essential (primary) hypertension: Secondary | ICD-10-CM | POA: Diagnosis not present

## 2023-04-19 DIAGNOSIS — K219 Gastro-esophageal reflux disease without esophagitis: Secondary | ICD-10-CM | POA: Diagnosis not present

## 2023-04-19 DIAGNOSIS — M150 Primary generalized (osteo)arthritis: Secondary | ICD-10-CM | POA: Diagnosis not present

## 2023-04-19 DIAGNOSIS — J301 Allergic rhinitis due to pollen: Secondary | ICD-10-CM | POA: Diagnosis not present

## 2023-04-19 DIAGNOSIS — E7800 Pure hypercholesterolemia, unspecified: Secondary | ICD-10-CM | POA: Diagnosis not present

## 2023-04-19 DIAGNOSIS — R42 Dizziness and giddiness: Secondary | ICD-10-CM | POA: Diagnosis not present

## 2023-06-11 ENCOUNTER — Encounter (HOSPITAL_BASED_OUTPATIENT_CLINIC_OR_DEPARTMENT_OTHER): Payer: Self-pay

## 2023-06-11 ENCOUNTER — Emergency Department (HOSPITAL_BASED_OUTPATIENT_CLINIC_OR_DEPARTMENT_OTHER): Payer: Medicare Other

## 2023-06-11 ENCOUNTER — Emergency Department (HOSPITAL_BASED_OUTPATIENT_CLINIC_OR_DEPARTMENT_OTHER)
Admission: EM | Admit: 2023-06-11 | Discharge: 2023-06-11 | Disposition: A | Discharge status: Home / Self Care | Payer: Medicare Other | Attending: Emergency Medicine | Admitting: Emergency Medicine

## 2023-06-11 ENCOUNTER — Other Ambulatory Visit: Payer: Self-pay

## 2023-06-11 DIAGNOSIS — M25512 Pain in left shoulder: Secondary | ICD-10-CM | POA: Diagnosis not present

## 2023-06-11 DIAGNOSIS — I10 Essential (primary) hypertension: Secondary | ICD-10-CM | POA: Insufficient documentation

## 2023-06-11 DIAGNOSIS — M19012 Primary osteoarthritis, left shoulder: Secondary | ICD-10-CM | POA: Diagnosis not present

## 2023-06-11 DIAGNOSIS — M778 Other enthesopathies, not elsewhere classified: Secondary | ICD-10-CM | POA: Diagnosis not present

## 2023-06-11 HISTORY — DX: Essential (primary) hypertension: I10

## 2023-06-11 LAB — COMPREHENSIVE METABOLIC PANEL
ALT: 28 U/L (ref 0–44)
AST: 28 U/L (ref 15–41)
Albumin: 4 g/dL (ref 3.5–5.0)
Alkaline Phosphatase: 60 U/L (ref 38–126)
Anion gap: 10 (ref 5–15)
BUN: 13 mg/dL (ref 8–23)
CO2: 26 mmol/L (ref 22–32)
Calcium: 9 mg/dL (ref 8.9–10.3)
Chloride: 103 mmol/L (ref 98–111)
Creatinine, Ser: 0.91 mg/dL (ref 0.44–1.00)
GFR, Estimated: >60 mL/min (ref >60)
Glucose, Bld: 121 mg/dL — ABNORMAL HIGH (ref 70–99)
Potassium: 3.9 mmol/L (ref 3.5–5.1)
Sodium: 139 mmol/L (ref 135–145)
Total Bilirubin: 0.8 mg/dL (ref <1.2)
Total Protein: 7.2 g/dL (ref 6.5–8.1)

## 2023-06-11 LAB — CBC
HCT: 41.9 % (ref 36.0–46.0)
Hemoglobin: 13.9 g/dL (ref 12.0–15.0)
MCH: 31.5 pg (ref 26.0–34.0)
MCHC: 33.2 g/dL (ref 30.0–36.0)
MCV: 95 fL (ref 80.0–100.0)
Platelets: 291 10*3/uL (ref 150–400)
RBC: 4.41 MIL/uL (ref 3.87–5.11)
RDW: 12.3 % (ref 11.5–15.5)
WBC: 6.4 10*3/uL (ref 4.0–10.5)
nRBC: 0 % (ref 0.0–0.2)

## 2023-06-11 LAB — URINALYSIS, ROUTINE W REFLEX MICROSCOPIC
Bilirubin Urine: NEGATIVE
Glucose, UA: NEGATIVE mg/dL
Hgb urine dipstick: NEGATIVE
Ketones, ur: NEGATIVE mg/dL
Leukocytes,Ua: NEGATIVE
Nitrite: NEGATIVE
Protein, ur: NEGATIVE mg/dL
Specific Gravity, Urine: 1.015 (ref 1.005–1.030)
pH: 6.5 (ref 5.0–8.0)

## 2023-06-11 LAB — LIPASE, BLOOD: Lipase: 47 U/L (ref 11–51)

## 2023-06-11 MED ORDER — OXYCODONE-ACETAMINOPHEN 5-325 MG PO TABS
1.0000 | ORAL_TABLET | Freq: Once | ORAL | Status: AC
  Administered 2023-06-11: 1 via ORAL
  Filled 2023-06-11: qty 1

## 2023-06-11 NOTE — Discharge Instructions (Signed)
We evaluated you for your shoulder pain.  Your x-ray did not show any broken bones.  Your laboratory testing was reassuring.  Please follow-up with your primary doctor.  You can also follow-up with a sports medicine physician.  Please take at 1000 mg of Tylenol every 6 hours as needed for pain.  If this does not help, you can also take meloxicam which you have already been prescribed.  If you have any new or worsening symptoms such as fevers or chills, chest pain, difficulty breathing, lightheadedness or dizziness, fainting, or any other concerning symptoms please return to the emergency department.

## 2023-06-11 NOTE — ED Triage Notes (Signed)
The patient has been having left shoulder pain for two weeks. She is having abd pain and nausea. Her eyes are burning.

## 2023-06-11 NOTE — ED Notes (Signed)
Attempted to collect UA. Pt unable to provide sample.

## 2023-06-11 NOTE — ED Provider Notes (Signed)
[facility] Provider Note  CSN: [id number] Arrival date & time: 06/11/23 4401  Chief Complaint(s) Abdominal Pain and Shoulder Pain  HPI Erica Keith is a 72 y.o. female history of hypertension hyperlipidemia presenting to the emergency department with multiple complaints.  Patient reports that she has had left shoulder pain for around 2 weeks, slightly worse.  No falls or trauma.  Worse with movement.  No fevers or chills.  No other symptoms like numbness or tingling, neck pain.  No chest pain or shortness of breath.  She also reports epigastric abdominal pain.  She reports this is chronic.  She reports the pain comes and goes.  She reports occasional nausea.  She reports this is entirely the same as previous episodes.  No vomiting.  Spouse reports sensation of burning eyes and dry eyes.  No visual disturbance or redness.   Past Medical History Past Medical History:  Diagnosis Date   Hyperlipidemia    Hypertension    There are no problems to display for this patient.  Home Medication(s) Prior to Admission medications   Medication Sig Start Date End Date Taking? Authorizing Provider  pantoprazole (PROTONIX) 40 MG tablet Take 1 tablet (40 mg total) by mouth daily. 10/06/18 10/06/19  Howard Pouch, MD  rosuvastatin (CRESTOR) 10 MG tablet Take 10 mg by mouth daily.    [provider]                                                                                                                                    Past Surgical History History reviewed. No pertinent surgical history. Family History Family History  Problem Relation Age of Onset   Breast cancer Neg Hx     Social History Social History   Tobacco Use   Smoking status: Never   Smokeless tobacco: Never  Substance Use Topics   Alcohol use: Never   Drug use: Never   Allergies Patient has no known allergies.  Review of Systems Review of Systems  All other  systems reviewed and are negative.   Physical Exam Vital Signs  I have reviewed the triage vital signs BP (!) 123/58   Pulse (!) 59   Temp 97.9 F (36.6 C) (Oral)   Resp 12   Ht 5\' 4"  (1.626 m)   Wt 77.6 kg   SpO2 95%   BMI 29.35 kg/m  Physical Exam Vitals and nursing note reviewed.  Constitutional:      General: She is not in acute distress.    Appearance: She is well-developed.  HENT:     Head: Normocephalic and atraumatic.     Mouth/Throat:     Mouth: Mucous membranes are moist.  Eyes:     General: No scleral icterus.    Extraocular Movements: Extraocular movements intact.     Pupils: Pupils are equal, round, and reactive to light.     Comments: No  injection, vision grossly normal,  Cardiovascular:     Rate and Rhythm: Normal rate and regular rhythm.     Heart sounds: No murmur heard. Pulmonary:     Effort: Pulmonary effort is normal. No respiratory distress.     Breath sounds: Normal breath sounds.  Abdominal:     General: Abdomen is flat.     Palpations: Abdomen is soft.     Tenderness: There is no abdominal tenderness.  Musculoskeletal:        General: No tenderness.     Right lower leg: No edema.     Left lower leg: No edema.     Comments: Mild tenderness to the anterior and superior shoulder.  Mild painful active range of motion but passive range of motion seems painless.  Skin:    General: Skin is warm and dry.  Neurological:     General: No focal deficit present.     Mental Status: She is alert. Mental status is at baseline.  Psychiatric:        Mood and Affect: Mood normal.        Behavior: Behavior normal.     ED Results and Treatments Labs (all labs ordered are listed, but only abnormal results are displayed) Labs Reviewed  COMPREHENSIVE METABOLIC PANEL - Abnormal; Notable for the following components:      Result Value   Glucose, Bld 121 (*)    All other components within normal limits  LIPASE, BLOOD  CBC  URINALYSIS, ROUTINE W REFLEX  MICROSCOPIC                                                                                                                          Radiology DG Shoulder Left  Result Date: 06/11/2023 CLINICAL DATA:  72 year old female with persistent shoulder pain for 2 weeks. EXAM: LEFT SHOULDER - 2+ VIEW COMPARISON:  Chest radiographs 10/06/2018. FINDINGS: Three views at 1035 hours. Bone mineralization is within normal limits for age. No glenohumeral joint dislocation. Proximal left humerus intact. Mild glenohumeral and AC joint degenerative spurring. Left clavicle and scapula appear intact. Visible left ribs and lung appear negative. IMPRESSION: No acute osseous abnormality identified about the left shoulder. Mild for age degeneration. Electronically Signed   By: Odessa Fleming M.D.   On: 06/11/2023 12:50    Pertinent labs & imaging results that were available during my care of the patient were reviewed by me and considered in my medical decision making (see MDM for details).  Medications Ordered in ED Medications  oxyCODONE-acetaminophen (PERCOCET/ROXICET) 5-325 MG per tablet 1 tablet (1 tablet Oral Given 06/11/23 1020)  Procedures Procedures  (including critical care time)  Medical Decision Making / ED Course   MDM:  72 year old with multiple complaints.  Patient first complains of shoulder pain.  On exam, she has some painful active range of motion of the shoulder.  Mild tenderness over the shoulder but no warmth.  Passive range of motion seems painless.  Suspect rotator cuff pathology.  Will advise follow-up with orthopedic surgery.  Very low concern for fracture or dislocation but given new symptom will obtain x-ray to evaluate for arthritic change.  Patient also reports abdominal pain.  She reports that this is a chronic symptom.  She reports it is not any different than  her chronic pain.  On exam she has no abdominal tenderness.  Very low concern for any acute issues such as pancreatitis, cholecystitis, perforation or obstruction, volvulus, diverticulitis, colitis without any new symptoms.  Will check some basic labs including lipase but if these are reassuring do not think patient will need further imaging.  She also reports some eye burning sensation.  On exam her vision is grossly normal and remainder of her ocular exam is grossly normal.  Pupils are reactive.  Very low concern for any acute issues such as acute angle-closure glaucoma, corneal abrasion, or other acute problem.  Could be dry eyes, recommend artificial tears.   Clinical Course as of 06/11/23 1337  Fri Jun 11, 2023  1333 Laboratory testing reassuring.  X-ray shows degenerative changes.  She reports her burning eye symptom has resolved and she also notes that she did have a history of dry eyes and used artificial tears previously for this complaint which has helped.  Recommend follow-up with sports medicine and her primary physician. Will discharge patient to home. All questions answered. Patient comfortable with plan of discharge. Return precautions discussed with patient and specified on the after visit summary.  [WS]    Clinical Course User Index [WS] Lonell Grandchild, MD     Additional history obtained: -Additional history obtained from spouse -External records from outside source obtained and reviewed including: Chart review including previous notes, labs, imaging, consultation notes including pmd notes   Lab Tests: -I ordered, reviewed, and interpreted labs.   The pertinent results include:   Labs Reviewed  COMPREHENSIVE METABOLIC PANEL - Abnormal; Notable for the following components:      Result Value   Glucose, Bld 121 (*)    All other components within normal limits  LIPASE, BLOOD  CBC  URINALYSIS, ROUTINE W REFLEX MICROSCOPIC    Notable for mild hyperglycemia   EKG    EKG Interpretation Date/Time:  Friday June 11 2023 09:52:46 EST Ventricular Rate:  75 PR Interval:  151 QRS Duration:  88 QT Interval:  417 QTC Calculation: 466 R Axis:   -37  Text Interpretation: Sinus rhythm Abnormal R-wave progression, early transition Inferior infarct, old Confirmed by Alvino Blood ([id number]) on 06/11/2023 10:13:18 AM         Imaging Studies ordered: I ordered imaging studies including XR shoulder On my interpretation imaging demonstrates no acute findings, chronic arthritic changes  I independently visualized and interpreted imaging. I agree with the radiologist interpretation   Medicines ordered and prescription drug management: Meds ordered this encounter  Medications   oxyCODONE-acetaminophen (PERCOCET/ROXICET) 5-325 MG per tablet 1 tablet    -I have reviewed the patients home medicines and have made adjustments as needed   Social Determinants of Health:  Diagnosis or treatment significantly limited by social determinants of health: obesity  Reevaluation: After the interventions noted above, I reevaluated the patient and found that their symptoms have improved  Co morbidities that complicate the patient evaluation  Past Medical History:  Diagnosis Date   Hyperlipidemia    Hypertension       Dispostion: Disposition decision including need for hospitalization was considered, and patient discharged from emergency department.    Final Clinical Impression(s) / ED Diagnoses Final diagnoses:  Acute pain of left shoulder     This chart was dictated using voice recognition software.  Despite best efforts to proofread,  errors can occur which can change the documentation meaning.    Lonell Grandchild, MD 06/11/23 [phone]

## 2023-06-11 NOTE — ED Notes (Signed)
Attempted to collect UA. Pt unable to urinate

## 2023-06-15 ENCOUNTER — Encounter: Payer: Self-pay | Admitting: Sports Medicine

## 2023-06-15 ENCOUNTER — Ambulatory Visit (INDEPENDENT_AMBULATORY_CARE_PROVIDER_SITE_OTHER): Payer: Medicare Other | Admitting: Sports Medicine

## 2023-06-15 VITALS — BP 124/80 | Ht 64.0 in | Wt 171.0 lb

## 2023-06-15 DIAGNOSIS — M5010 Cervical disc disorder with radiculopathy, unspecified cervical region: Secondary | ICD-10-CM | POA: Diagnosis not present

## 2023-06-15 MED ORDER — MELOXICAM 15 MG PO TABS: 15.0000 mg | ORAL_TABLET | Freq: Every day | ORAL | 1 refills | Status: AC | PRN

## 2023-06-15 NOTE — Progress Notes (Signed)
   Subjective:    Patient ID: Erica Keith, female    DOB: 09-04-1950, 72 y.o.   MRN: [id number]  HPI chief complaint: Left shoulder pain  Patient is a very pleasant 72 year old female that presents today with a little over 2 weeks of left shoulder pain.  Pain began acutely while doing dishes.  Pain became so severe that she sought treatment in the emergency department on November 8.  An x-ray of her shoulder was done which showed some mild arthritis.  She was told to follow-up with [place].  She describes left-sided neck pain that is worse with neck extension.  This results in radiating pain into the left arm.  She has found some relief with Tylenol and lidocaine patches.  She has also been prescribed meloxicam in the past for other aches and pains but has not tried it for her current shoulder pain.  She denies pain with shoulder motion.  She denies weakness in the arm.  She is here today with her husband.  Past medical history reviewed Medications reviewed Allergies reviewed    Review of Systems As above    Objective:   Physical Exam  Well-developed, well-nourished.  No acute distress  Cervical spine: Positive Spurling's maneuver to the left.  She is tender to palpation along the left paraspinal musculature.  Neurological exam: Global left-sided weakness secondary to pain.  Trace triceps reflex on the left compared to 2/4 on the right.  Biceps and brachioradialis reflexes are 2/4 bilaterally.  Left shoulder: Full painless active and passive range of motion.  No signs of impingement.      Assessment & Plan:   Left arm cervical radiculopathy  60 Sterapred Dosepak to take as directed.  She will then follow this with meloxicam 15 mg daily as needed.  Alternatively, she may also continue with her Tylenol and lidocaine patches.  She will start physical therapy and may wean to home exercise program per the therapist discretion.  If symptoms do not improve then consider imaging at that time.   Follow-up for ongoing or recalcitrant issues.  This note was dictated using Dragon naturally speaking software and may contain errors in syntax, spelling, or content which have not been identified prior to signing this note.

## 2023-06-30 NOTE — Therapy (Signed)
OUTPATIENT PHYSICAL THERAPY CERVICAL EVALUATION   Patient Name: Erica Keith MRN: [id number] DOB:06/05/1951, 72 y.o., female Today's Date: 07/07/2023   END OF SESSION:  PT End of Session - 07/07/23 1019     Visit Number 1    Date for PT Re-Evaluation 09/01/23    Authorization Type Medicare & [employer] Medicare supplement    PT Start Time 1019    PT Stop Time 1104    PT Time Calculation (min) 45 min    Activity Tolerance Patient tolerated treatment well    Behavior During Therapy WFL for tasks assessed/performed             Past Medical History:  Diagnosis Date   Hyperlipidemia    Hypertension    History reviewed. No pertinent surgical history. There are no problems to display for this patient.   PCP: Noberto Retort, MD   REFERRING PROVIDER: Ralene Cork, DO   REFERRING DIAG: M50.10 (ICD-10-CM) - Cervical disc disorder with radiculopathy of cervical region   THERAPY DIAG:  Radiculopathy, cervical region  Abnormal posture  Other muscle spasm  Muscle weakness (generalized)  RATIONALE FOR EVALUATION AND TREATMENT: Rehabilitation  ONSET DATE: ~1 month ago  NEXT MD VISIT: None scheduled   SUBJECTIVE:                                                                                                                                                                                                         SUBJECTIVE STATEMENT: Pt reports the MD told her she has a pinched nerve in her neck which causes intermittent pins and needles in her L arm.  First noted while doing the dishes ~1 month ago, then woke in severe pain the next day which caused her to go to the ER.  Pain prevents her from sleeping on her L side.  Also notes difficulty extending her neck to put in her eyedrops, but not not as bad as it was initially.  Hand dominance: Right  PAIN: Are you having pain? Yes: NPRS scale: 8/10, sometimes 10+/10 Pain location: L UE to fingertips Pain description: pins  and needles in L arm Aggravating factors: unpredictable other than lying/sleeping on her left side Relieving factors: laying flat on her back   PERTINENT HISTORY:  HTN, HLD  PRECAUTIONS: None  RED FLAGS: None  HAND DOMINANCE: Right  WEIGHT BEARING RESTRICTIONS: No  FALLS:  Has patient fallen in last 6 months? No  LIVING ENVIRONMENT: Lives with: lives with their spouse Lives in: House/apartment Stairs: No Has following equipment at home: None  OCCUPATION: Retired  PLOF: Independent and Leisure: sewing, puzzles, mostly sedentary  PATIENT GOALS: "To get rid of the pain."   OBJECTIVE: (objective measures completed at initial evaluation unless otherwise dated)  DIAGNOSTIC FINDINGS:  06/11/23 - DG left shoulder: IMPRESSION: No acute osseous abnormality identified about the left shoulder. Mild for age degeneration.  PATIENT SURVEYS:  NDI  5 / 50 = 10.0 %  COGNITION: Overall cognitive status: Within functional limits for tasks assessed  SENSATION: Intermittent L UE pins and needles  POSTURE:  rounded shoulders, forward head, and increased thoracic kyphosis  PALPATION: TTP over L pecs. Increased muscle tension in L>R UT, LS and cervicothoracic paraspainals.   CERVICAL ROM:   Active ROM Eval  Flexion 42  Extension 35 ^  Right lateral flexion 18 *  Left lateral flexion 22 *  Right rotation 37 *  Left rotation 47 ^   (Blank rows = not tested, ^ = increased pins and needles, * = tight)  UPPER EXTREMITY ROM: B UE WFL  UPPER EXTREMITY MMT:  MMT Right eval Left eval  Shoulder flexion 5 5  Shoulder extension 5 4+  Shoulder abduction 5 4+  Shoulder adduction    Shoulder internal rotation 5 5  Shoulder external rotation 4+ 4+  Middle trapezius    Lower trapezius    Elbow flexion    Elbow extension    Wrist flexion    Wrist extension    Wrist ulnar deviation    Wrist radial deviation    Wrist pronation    Wrist supination    Grip strength     (Blank  rows = not tested)  CERVICAL SPECIAL TESTS:  Spurling's test: Negative and Distraction test: Negative   TODAY'S TREATMENT:   07/07/23 THERAPEUTIC EXERCISE: to improve flexibility, strength and mobility.  Demonstration, verbal and tactile cues throughout for technique.  Supine cervical retraction into pillow 5 x 5" Seated scapular retraction 5 x 5" Seated scapular retraction + B shoulder ER 5 x 5" Seated gentle L UT stretch 2 x 30" Seated gentle L LS stretch 2 x 30" Seated L brachial plexus nerve glide x 10   PATIENT EDUCATION:  Education details: PT eval findings, anticipated POC, initial HEP, and postural awareness  Person educated: Patient Education method: Explanation, Demonstration, Verbal cues, and Handouts Education comprehension: verbalized understanding, returned demonstration, verbal cues required, and needs further education  HOME EXERCISE PROGRAM: Access Code: [email or web address] URL: [email or web address] Date: 07/07/2023 Prepared by: Glenetta Hew  Exercises - Supine Cervical Retraction with Towel  - 2-3 x daily - 7 x weekly - 2 sets - 10 reps - 3 sec hold - Seated Scapular Retraction  - 2-3 x daily - 7 x weekly - 2 sets - 10 reps - 5 sec hold - Seated Scapular Retraction with External Rotation  - 2-3 x daily - 7 x weekly - 2 sets - 10 reps - 3 sec hold - Seated Gentle Upper Trapezius Stretch  - 2-3 x daily - 7 x weekly - 3 reps - 30 sec hold - Gentle Levator Scapulae Stretch  - 2-3 x daily - 7 x weekly - 3 reps - 30 sec hold  Patient Education - Brachial plexus nerve glide  ASSESSMENT:  CLINICAL IMPRESSION: Erica Keith is a 72 y.o. female who was referred to physical therapy for evaluation and treatment for cervical disc disorder with left cervical radiculopathy.  She reports onset ~1 month ago while doing dishes, waking up in severe pain the next morning.  Current  symptoms now more intermittent pins-and-needles in her L UE without predictable trigger other  than lying/sleeping on her left side.  Current deficits include abnormal posture, increased muscle tension in L>R neck and upper shoulder musculature as well as left pecs, mild L>R UE weakness with associated postural muscle weakness.  Pain/pins-and-needles does not usually interfere with most daily activities although she does not work on her sewing or puzzles as much since this started, and pins-and-needles will prevent her from sleeping on her left side.  Dietrich will benefit from skilled PT to address above deficits to improve mobility and activity tolerance with decreased pain interference.  Patient provided instruction in initial HEP with good return demonstration and patient noting reduction in intensity of L UE pins-and-needles.  OBJECTIVE IMPAIRMENTS: decreased activity tolerance, decreased knowledge of condition, decreased ROM, decreased strength, hypomobility, increased fascial restrictions, impaired perceived functional ability, increased muscle spasms, impaired flexibility, impaired UE functional use, improper body mechanics, postural dysfunction, and pain.   ACTIVITY LIMITATIONS: lifting and sleeping  PARTICIPATION LIMITATIONS: meal prep, cleaning, laundry, shopping, and community activity  PERSONAL FACTORS: Fitness, Past/current experiences, Time since onset of injury/illness/exacerbation, and 1-2 comorbidities: HTN, HLD  are also affecting patient's functional outcome.   REHAB POTENTIAL: Excellent  CLINICAL DECISION MAKING: Stable/uncomplicated  EVALUATION COMPLEXITY: Low   GOALS: Goals reviewed with patient? Yes  SHORT TERM GOALS: Target date: 08/04/2023  Patient will be independent with initial HEP to improve outcomes and carryover.  Baseline:  Goal status: INITIAL  2.  Patient will report 25% improvement in L UE pins-and-needles to improve QOL.  Baseline: 8/10 on eval, sometimes up to 10+/10 Goal status: INITIAL  LONG TERM GOALS: Target date: 09/01/2023  Patient will be  independent with ongoing/advanced HEP for self-management at home.  Baseline:  Goal status: INITIAL  2.  Patient will demonstrate improved posture to decrease muscle imbalance. Baseline: Forward head and rounded shoulder posture Goal status: INITIAL  3.  Patient will report 50-75% improvement in neck pain/L UE pins-and-needles to improve QOL.  Baseline: 8/10 on eval, sometimes up to 10+/10 Goal status: INITIAL  4.  Patient will demonstrate full pain free cervical ROM for safety with driving.  Baseline: Refer to above cervical ROM table Goal status: INITIAL  5.  Patient will report following</= 5% on NDI to demonstrate improved functional ability.  Baseline:  5 / 50 = 10.0 % Goal status: INITIAL  6.  Patient will report no sleep disturbance due to L UE pins-and-needles. Baseline: Unable to sleep on L side due to L UE pins-and-needles Goal status: INITIAL    PLAN:  PT FREQUENCY: 2x/week  PT DURATION: 6-8 weeks  PLANNED INTERVENTIONS: 97164- PT Re-evaluation, 97110-Therapeutic exercises, 97530- Therapeutic activity, 97112- Neuromuscular re-education, 97535- Self Care, [id number]- Manual therapy, 97014- Electrical stimulation (unattended), [id number]- Electrical stimulation (manual), [id number]- Ultrasound, 19147- Traction (mechanical), [id number]- Ionotophoresis 4mg /ml Dexamethasone, Patient/Family education, Taping, Dry Needling, Joint mobilization, Spinal mobilization, Cryotherapy, and Moist heat  PLAN FOR NEXT SESSION: Review initial HEP; progress gentle cervical ROM and stretching; progress postural strengthening; MT +/- DN to address abnormal muscle tension in L lateral neck and upper shoulder as well as pecs   Marry Guan, PT 07/07/2023, 11:30 AM

## 2023-07-07 ENCOUNTER — Encounter: Payer: Self-pay | Admitting: Physical Therapy

## 2023-07-07 ENCOUNTER — Ambulatory Visit: Payer: Medicare Other | Attending: Family Medicine | Admitting: Physical Therapy

## 2023-07-07 ENCOUNTER — Other Ambulatory Visit: Payer: Self-pay

## 2023-07-07 DIAGNOSIS — M6281 Muscle weakness (generalized): Secondary | ICD-10-CM | POA: Diagnosis not present

## 2023-07-07 DIAGNOSIS — R293 Abnormal posture: Secondary | ICD-10-CM

## 2023-07-07 DIAGNOSIS — M62838 Other muscle spasm: Secondary | ICD-10-CM | POA: Diagnosis not present

## 2023-07-07 DIAGNOSIS — M5412 Radiculopathy, cervical region: Secondary | ICD-10-CM

## 2023-07-07 DIAGNOSIS — M5010 Cervical disc disorder with radiculopathy, unspecified cervical region: Secondary | ICD-10-CM | POA: Diagnosis not present

## 2023-07-12 DIAGNOSIS — M9901 Segmental and somatic dysfunction of cervical region: Secondary | ICD-10-CM | POA: Diagnosis not present

## 2023-07-12 DIAGNOSIS — M5127 Other intervertebral disc displacement, lumbosacral region: Secondary | ICD-10-CM | POA: Diagnosis not present

## 2023-07-12 DIAGNOSIS — M9902 Segmental and somatic dysfunction of thoracic region: Secondary | ICD-10-CM | POA: Diagnosis not present

## 2023-07-12 DIAGNOSIS — M5125 Other intervertebral disc displacement, thoracolumbar region: Secondary | ICD-10-CM | POA: Diagnosis not present

## 2023-07-12 DIAGNOSIS — M9903 Segmental and somatic dysfunction of lumbar region: Secondary | ICD-10-CM | POA: Diagnosis not present

## 2023-07-12 DIAGNOSIS — M5022 Other cervical disc displacement, mid-cervical region, unspecified level: Secondary | ICD-10-CM | POA: Diagnosis not present

## 2023-07-15 ENCOUNTER — Ambulatory Visit: Payer: Medicare Other | Admitting: Physical Therapy

## 2023-07-15 ENCOUNTER — Encounter: Payer: Self-pay | Admitting: Physical Therapy

## 2023-07-15 DIAGNOSIS — M62838 Other muscle spasm: Secondary | ICD-10-CM | POA: Diagnosis not present

## 2023-07-15 DIAGNOSIS — M5412 Radiculopathy, cervical region: Secondary | ICD-10-CM

## 2023-07-15 DIAGNOSIS — R293 Abnormal posture: Secondary | ICD-10-CM | POA: Diagnosis not present

## 2023-07-15 DIAGNOSIS — M5010 Cervical disc disorder with radiculopathy, unspecified cervical region: Secondary | ICD-10-CM | POA: Diagnosis not present

## 2023-07-15 DIAGNOSIS — M6281 Muscle weakness (generalized): Secondary | ICD-10-CM | POA: Diagnosis not present

## 2023-07-15 NOTE — Therapy (Signed)
OUTPATIENT PHYSICAL THERAPY CERVICAL TREATMENT    Patient Name: Erica Keith MRN: [id number] DOB:Aug 25, 1950, 72 y.o., female Today's Date: 07/15/2023   END OF SESSION:  PT End of Session - 07/15/23 1416     Visit Number 2    Date for PT Re-Evaluation 09/01/23    Authorization Type Medicare & [employer] Medicare supplement    PT Start Time 1417    PT Stop Time 1458    PT Time Calculation (min) 41 min    Activity Tolerance Patient tolerated treatment well    Behavior During Therapy WFL for tasks assessed/performed              Past Medical History:  Diagnosis Date   Hyperlipidemia    Hypertension    History reviewed. No pertinent surgical history. There are no active problems to display for this patient.   PCP: Noberto Retort, MD   REFERRING PROVIDER: Ralene Cork, DO   REFERRING DIAG: M50.10 (ICD-10-CM) - Cervical disc disorder with radiculopathy of cervical region   THERAPY DIAG:  Radiculopathy, cervical region  Abnormal posture  Other muscle spasm  Muscle weakness (generalized)  RATIONALE FOR EVALUATION AND TREATMENT: Rehabilitation  ONSET DATE: ~1 month ago  NEXT MD VISIT: None scheduled   SUBJECTIVE:                                                                                                                                                                                                         SUBJECTIVE STATEMENT:  I have been using a neck stretcher from [employer] and that and the exercises have been helping me a lot. Not having pain at this point, still getting pins and needles when I lay down in a different position but getting less.     EVAL: Pt reports the MD told her she has a pinched nerve in her neck which causes intermittent pins and needles in her L arm.  First noted while doing the dishes ~1 month ago, then woke in severe pain the next day which caused her to go to the ER.  Pain prevents her from sleeping on her L side.  Also notes  difficulty extending her neck to put in her eyedrops, but not not as bad as it was initially.  Hand dominance: Right  PAIN: Are you having pain? No 0/10 now, at worst maybe 2-3/10  PERTINENT HISTORY:  HTN, HLD  PRECAUTIONS: None  RED FLAGS: None  HAND DOMINANCE: Right  WEIGHT BEARING RESTRICTIONS: No  FALLS:  Has patient fallen in last 6 months? No  LIVING ENVIRONMENT: Lives with: lives with their spouse Lives in: House/apartment Stairs: No Has following equipment at home: None  OCCUPATION: Retired  PLOF: Independent and Leisure: sewing, puzzles, mostly sedentary  PATIENT GOALS: "To get rid of the pain."   OBJECTIVE: (objective measures completed at initial evaluation unless otherwise dated)  DIAGNOSTIC FINDINGS:  06/11/23 - DG left shoulder: IMPRESSION: No acute osseous abnormality identified about the left shoulder. Mild for age degeneration.  PATIENT SURVEYS:  NDI  5 / 50 = 10.0 %  COGNITION: Overall cognitive status: Within functional limits for tasks assessed  SENSATION: Intermittent L UE pins and needles  POSTURE:  rounded shoulders, forward head, and increased thoracic kyphosis  PALPATION: TTP over L pecs. Increased muscle tension in L>R UT, LS and cervicothoracic paraspainals.   CERVICAL ROM:   Active ROM Eval  Flexion 42  Extension 35 ^  Right lateral flexion 18 *  Left lateral flexion 22 *  Right rotation 37 *  Left rotation 47 ^   (Blank rows = not tested, ^ = increased pins and needles, * = tight)  UPPER EXTREMITY ROM: B UE WFL  UPPER EXTREMITY MMT:  MMT Right eval Left eval  Shoulder flexion 5 5  Shoulder extension 5 4+  Shoulder abduction 5 4+  Shoulder adduction    Shoulder internal rotation 5 5  Shoulder external rotation 4+ 4+  Middle trapezius    Lower trapezius    Elbow flexion    Elbow extension    Wrist flexion    Wrist extension    Wrist ulnar deviation    Wrist radial deviation    Wrist pronation    Wrist  supination    Grip strength     (Blank rows = not tested)  CERVICAL SPECIAL TESTS:  Spurling's test: Negative and Distraction test: Negative   TODAY'S TREATMENT:    07/15/23  TherEx  Attempted UBE- instant pins and needles  Chin tucks- again had instant pins and needles  UT stretch L 3x30 seconds Levator stretch 2x30 seconds B  Scapular retraction red TB x12 Shoulder extension red TB x12  Shoulder ER- attempted but increased pain    Selfcare  Education on likely BPPV as well as offer to diagnose/treat today but might flare radicular sx in UE given positioning required for accurate diagnosis and treatment. She opted to hold on this for now. Education given on anatomy of C-spine and cervical extension contributing to radicular symptoms today, role of postural training and strengthening as well as neck ROM/strength in reducing pressure on involved nerve root.     Manual  Manual cervical traction intermittent during session due return of pins/needles throughout  Suboccipital release         07/07/23 THERAPEUTIC EXERCISE: to improve flexibility, strength and mobility.  Demonstration, verbal and tactile cues throughout for technique.  Supine cervical retraction into pillow 5 x 5" Seated scapular retraction 5 x 5" Seated scapular retraction + B shoulder ER 5 x 5" Seated gentle L UT stretch 2 x 30" Seated gentle L LS stretch 2 x 30" Seated L brachial plexus nerve glide x 10   PATIENT EDUCATION:  Education details: PT eval findings, anticipated POC, initial HEP, and postural awareness  Person educated: Patient Education method: Explanation, Demonstration, Verbal cues, and Handouts Education comprehension: verbalized understanding, returned demonstration, verbal cues required, and needs further education  HOME EXERCISE PROGRAM: Access Code: [email or web address] URL: [email or web address] Date: 07/07/2023 Prepared by: Glenetta Hew  Exercises - Supine Cervical  Retraction with Towel  -  2-3 x daily - 7 x weekly - 2 sets - 10 reps - 3 sec hold - Seated Scapular Retraction  - 2-3 x daily - 7 x weekly - 2 sets - 10 reps - 5 sec hold - Seated Scapular Retraction with External Rotation  - 2-3 x daily - 7 x weekly - 2 sets - 10 reps - 3 sec hold - Seated Gentle Upper Trapezius Stretch  - 2-3 x daily - 7 x weekly - 3 reps - 30 sec hold - Gentle Levator Scapulae Stretch  - 2-3 x daily - 7 x weekly - 3 reps - 30 sec hold  Patient Education - Brachial plexus nerve glide  ASSESSMENT:  CLINICAL IMPRESSION:  07/15/23  Pt arrives today doing better, her HEP is going well and she bought a neck stretcher from [employer] that is helping her feel much better as well. Unfortunately once we got into functional exercises today, her pain and radicular sx were easily aggravated (she does tend to go into cervical extension with exercises, cued to avoid this and pain/sx improved). 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 She would like to go on hold after today- left her with existing HEP as she seems to be responding very well to this. Educated that she can return within 30 days but after that period would need new MD referral to return to therapy.      EVAL: Erica Keith is a 72 y.o. female who was referred to physical therapy for evaluation and treatment for cervical disc disorder with left cervical radiculopathy.  She reports onset ~1 month ago while doing dishes, waking up in severe pain the next morning.  Current symptoms now more intermittent pins-and-needles in her L UE without predictable trigger other than lying/sleeping on her left side.  Current deficits include abnormal posture, increased muscle tension in L>R neck and upper shoulder musculature as well as left pecs, mild L>R UE weakness with associated postural muscle weakness.   Pain/pins-and-needles does not usually interfere with most daily activities although she does not work on her sewing or puzzles as much since this started, and pins-and-needles will prevent her from sleeping on her left side.  Andraya will benefit from skilled PT to address above deficits to improve mobility and activity tolerance with decreased pain interference.  Patient provided instruction in initial HEP with good return demonstration and patient noting reduction in intensity of L UE pins-and-needles.  OBJECTIVE IMPAIRMENTS: decreased activity tolerance, decreased knowledge of condition, decreased ROM, decreased strength, hypomobility, increased fascial restrictions, impaired perceived functional ability, increased muscle spasms, impaired flexibility, impaired UE functional use, improper body mechanics, postural dysfunction, and pain.   ACTIVITY LIMITATIONS: lifting and sleeping  PARTICIPATION LIMITATIONS: meal prep, cleaning, laundry, shopping, and community activity  PERSONAL FACTORS: Fitness, Past/current experiences, Time since onset of injury/illness/exacerbation, and 1-2 comorbidities: HTN, HLD  are also affecting patient's functional outcome.   REHAB POTENTIAL: Excellent  CLINICAL DECISION MAKING: Stable/uncomplicated  EVALUATION COMPLEXITY: Low   GOALS: Goals reviewed with patient? Yes  SHORT TERM GOALS: Target date: 08/04/2023  Patient will be independent with initial HEP to improve outcomes and carryover.  Baseline:  Goal status: INITIAL  2.  Patient will report 25% improvement in L UE pins-and-needles to improve QOL.  Baseline: 8/10 on eval, sometimes up to 10+/10 Goal status: INITIAL  LONG TERM GOALS: Target date: 09/01/2023  Patient  will be independent with ongoing/advanced HEP for self-management at home.  Baseline:  Goal status: INITIAL  2.  Patient will demonstrate improved posture to decrease muscle imbalance. Baseline: Forward head and rounded shoulder  posture Goal status: INITIAL  3.  Patient will report 50-75% improvement in neck pain/L UE pins-and-needles to improve QOL.  Baseline: 8/10 on eval, sometimes up to 10+/10 Goal status: INITIAL  4.  Patient will demonstrate full pain free cervical ROM for safety with driving.  Baseline: Refer to above cervical ROM table Goal status: INITIAL  5.  Patient will report following</= 5% on NDI to demonstrate improved functional ability.  Baseline:  5 / 50 = 10.0 % Goal status: INITIAL  6.  Patient will report no sleep disturbance due to L UE pins-and-needles. Baseline: Unable to sleep on L side due to L UE pins-and-needles Goal status: INITIAL    PLAN:  PT FREQUENCY: 2x/week  PT DURATION: 6-8 weeks  PLANNED INTERVENTIONS: 97164- PT Re-evaluation, 97110-Therapeutic exercises, 97530- Therapeutic activity, 97112- Neuromuscular re-education, 97535- Self Care, 09811- Manual therapy, 97014- Electrical stimulation (unattended), [id number]- Electrical stimulation (manual), [id number]- Ultrasound, 91478- Traction (mechanical), [id number]- Ionotophoresis 4mg /ml Dexamethasone, Patient/Family education, Taping, Dry Needling, Joint mobilization, Spinal mobilization, Cryotherapy, and Moist heat  PLAN FOR NEXT SESSION: on hold per her request- insurance is changing at the start of the year. Will DC after 08/15/23 if we do not hear from her in this time frame.   Nedra Hai, PT, DPT 07/15/23 3:08 PM

## 2023-07-23 ENCOUNTER — Encounter: Payer: Medicare Other | Admitting: Physical Therapy

## 2024-01-04 DIAGNOSIS — G8929 Other chronic pain: Secondary | ICD-10-CM | POA: Diagnosis not present

## 2024-01-04 DIAGNOSIS — R300 Dysuria: Secondary | ICD-10-CM | POA: Diagnosis not present

## 2024-01-04 DIAGNOSIS — M5442 Lumbago with sciatica, left side: Secondary | ICD-10-CM | POA: Diagnosis not present

## 2024-01-11 DIAGNOSIS — N3000 Acute cystitis without hematuria: Secondary | ICD-10-CM | POA: Diagnosis not present

## 2024-01-12 DIAGNOSIS — M9902 Segmental and somatic dysfunction of thoracic region: Secondary | ICD-10-CM | POA: Diagnosis not present

## 2024-01-12 DIAGNOSIS — M5125 Other intervertebral disc displacement, thoracolumbar region: Secondary | ICD-10-CM | POA: Diagnosis not present

## 2024-01-12 DIAGNOSIS — M5127 Other intervertebral disc displacement, lumbosacral region: Secondary | ICD-10-CM | POA: Diagnosis not present

## 2024-01-12 DIAGNOSIS — M9901 Segmental and somatic dysfunction of cervical region: Secondary | ICD-10-CM | POA: Diagnosis not present

## 2024-01-12 DIAGNOSIS — M9903 Segmental and somatic dysfunction of lumbar region: Secondary | ICD-10-CM | POA: Diagnosis not present

## 2024-01-12 DIAGNOSIS — M5022 Other cervical disc displacement, mid-cervical region, unspecified level: Secondary | ICD-10-CM | POA: Diagnosis not present

## 2024-01-21 DIAGNOSIS — N3000 Acute cystitis without hematuria: Secondary | ICD-10-CM | POA: Diagnosis not present

## 2024-02-02 DIAGNOSIS — M5442 Lumbago with sciatica, left side: Secondary | ICD-10-CM | POA: Diagnosis not present

## 2024-02-02 DIAGNOSIS — M6281 Muscle weakness (generalized): Secondary | ICD-10-CM | POA: Diagnosis not present

## 2024-02-02 DIAGNOSIS — I10 Essential (primary) hypertension: Secondary | ICD-10-CM | POA: Diagnosis not present

## 2024-02-02 DIAGNOSIS — M2569 Stiffness of other specified joint, not elsewhere classified: Secondary | ICD-10-CM | POA: Diagnosis not present

## 2024-02-07 DIAGNOSIS — M9903 Segmental and somatic dysfunction of lumbar region: Secondary | ICD-10-CM | POA: Diagnosis not present

## 2024-02-07 DIAGNOSIS — M5127 Other intervertebral disc displacement, lumbosacral region: Secondary | ICD-10-CM | POA: Diagnosis not present

## 2024-02-07 DIAGNOSIS — M6281 Muscle weakness (generalized): Secondary | ICD-10-CM | POA: Diagnosis not present

## 2024-02-07 DIAGNOSIS — I10 Essential (primary) hypertension: Secondary | ICD-10-CM | POA: Diagnosis not present

## 2024-02-07 DIAGNOSIS — M5125 Other intervertebral disc displacement, thoracolumbar region: Secondary | ICD-10-CM | POA: Diagnosis not present

## 2024-02-07 DIAGNOSIS — M9902 Segmental and somatic dysfunction of thoracic region: Secondary | ICD-10-CM | POA: Diagnosis not present

## 2024-02-07 DIAGNOSIS — M5022 Other cervical disc displacement, mid-cervical region, unspecified level: Secondary | ICD-10-CM | POA: Diagnosis not present

## 2024-02-07 DIAGNOSIS — M5442 Lumbago with sciatica, left side: Secondary | ICD-10-CM | POA: Diagnosis not present

## 2024-02-07 DIAGNOSIS — M2569 Stiffness of other specified joint, not elsewhere classified: Secondary | ICD-10-CM | POA: Diagnosis not present

## 2024-02-07 DIAGNOSIS — M9901 Segmental and somatic dysfunction of cervical region: Secondary | ICD-10-CM | POA: Diagnosis not present

## 2024-02-28 DIAGNOSIS — M9902 Segmental and somatic dysfunction of thoracic region: Secondary | ICD-10-CM | POA: Diagnosis not present

## 2024-02-28 DIAGNOSIS — M9901 Segmental and somatic dysfunction of cervical region: Secondary | ICD-10-CM | POA: Diagnosis not present

## 2024-02-28 DIAGNOSIS — M5127 Other intervertebral disc displacement, lumbosacral region: Secondary | ICD-10-CM | POA: Diagnosis not present

## 2024-02-28 DIAGNOSIS — M9903 Segmental and somatic dysfunction of lumbar region: Secondary | ICD-10-CM | POA: Diagnosis not present

## 2024-02-28 DIAGNOSIS — M5125 Other intervertebral disc displacement, thoracolumbar region: Secondary | ICD-10-CM | POA: Diagnosis not present

## 2024-02-28 DIAGNOSIS — M5022 Other cervical disc displacement, mid-cervical region, unspecified level: Secondary | ICD-10-CM | POA: Diagnosis not present

## 2024-03-13 DIAGNOSIS — M9903 Segmental and somatic dysfunction of lumbar region: Secondary | ICD-10-CM | POA: Diagnosis not present

## 2024-03-13 DIAGNOSIS — M5125 Other intervertebral disc displacement, thoracolumbar region: Secondary | ICD-10-CM | POA: Diagnosis not present

## 2024-03-13 DIAGNOSIS — M9901 Segmental and somatic dysfunction of cervical region: Secondary | ICD-10-CM | POA: Diagnosis not present

## 2024-03-13 DIAGNOSIS — M5022 Other cervical disc displacement, mid-cervical region, unspecified level: Secondary | ICD-10-CM | POA: Diagnosis not present

## 2024-03-13 DIAGNOSIS — M5127 Other intervertebral disc displacement, lumbosacral region: Secondary | ICD-10-CM | POA: Diagnosis not present

## 2024-03-13 DIAGNOSIS — M9902 Segmental and somatic dysfunction of thoracic region: Secondary | ICD-10-CM | POA: Diagnosis not present

## 2024-03-27 DIAGNOSIS — M9903 Segmental and somatic dysfunction of lumbar region: Secondary | ICD-10-CM | POA: Diagnosis not present

## 2024-03-27 DIAGNOSIS — M5125 Other intervertebral disc displacement, thoracolumbar region: Secondary | ICD-10-CM | POA: Diagnosis not present

## 2024-03-27 DIAGNOSIS — M9902 Segmental and somatic dysfunction of thoracic region: Secondary | ICD-10-CM | POA: Diagnosis not present

## 2024-03-27 DIAGNOSIS — M9901 Segmental and somatic dysfunction of cervical region: Secondary | ICD-10-CM | POA: Diagnosis not present

## 2024-03-27 DIAGNOSIS — M5022 Other cervical disc displacement, mid-cervical region, unspecified level: Secondary | ICD-10-CM | POA: Diagnosis not present

## 2024-03-27 DIAGNOSIS — M5127 Other intervertebral disc displacement, lumbosacral region: Secondary | ICD-10-CM | POA: Diagnosis not present

## 2024-04-26 DIAGNOSIS — E7800 Pure hypercholesterolemia, unspecified: Secondary | ICD-10-CM | POA: Diagnosis not present

## 2024-04-26 DIAGNOSIS — K219 Gastro-esophageal reflux disease without esophagitis: Secondary | ICD-10-CM | POA: Diagnosis not present

## 2024-04-26 DIAGNOSIS — I10 Essential (primary) hypertension: Secondary | ICD-10-CM | POA: Diagnosis not present

## 2024-04-26 DIAGNOSIS — M545 Low back pain, unspecified: Secondary | ICD-10-CM | POA: Diagnosis not present
